# Patient Record
Sex: Male | Born: 1980 | Race: Black or African American | Hispanic: No | Marital: Single | Smoking: Never smoker
Health system: Southern US, Community
[De-identification: ages and names within clinical notes are randomized; demographics above are authoritative.]

## PROBLEM LIST (undated history)

## (undated) DIAGNOSIS — J309 Allergic rhinitis, unspecified: Secondary | ICD-10-CM

## (undated) HISTORY — PX: RECONSTRUCTION OF EYELID: SHX6576

## (undated) HISTORY — DX: Allergic rhinitis, unspecified: J30.9

---

## 2007-08-25 ENCOUNTER — Emergency Department (HOSPITAL_COMMUNITY): Admission: EM | Admit: 2007-08-25 | Discharge: 2007-08-25 | Payer: Self-pay | Admitting: Emergency Medicine

## 2008-03-22 ENCOUNTER — Emergency Department (HOSPITAL_COMMUNITY): Admission: EM | Admit: 2008-03-22 | Discharge: 2008-03-22 | Payer: Self-pay | Admitting: Emergency Medicine

## 2008-05-01 ENCOUNTER — Emergency Department (HOSPITAL_COMMUNITY): Admission: EM | Admit: 2008-05-01 | Discharge: 2008-05-01 | Payer: Self-pay | Admitting: Family Medicine

## 2008-11-13 ENCOUNTER — Emergency Department (HOSPITAL_COMMUNITY): Admission: EM | Admit: 2008-11-13 | Discharge: 2008-11-13 | Payer: Self-pay | Admitting: Emergency Medicine

## 2009-09-10 ENCOUNTER — Emergency Department (HOSPITAL_COMMUNITY): Admission: EM | Admit: 2009-09-10 | Discharge: 2009-09-10 | Payer: Self-pay | Admitting: Family Medicine

## 2010-03-13 ENCOUNTER — Emergency Department (HOSPITAL_COMMUNITY)
Admission: EM | Admit: 2010-03-13 | Discharge: 2010-03-13 | Payer: Self-pay | Source: Home / Self Care | Admitting: Family Medicine

## 2010-06-29 ENCOUNTER — Emergency Department (HOSPITAL_COMMUNITY): Admission: EM | Admit: 2010-06-29 | Discharge: 2010-06-29 | Payer: Self-pay | Admitting: Family Medicine

## 2010-10-25 LAB — POCT I-STAT, CHEM 8
Chloride: 102 mEq/L (ref 96–112)
HCT: 55 % — ABNORMAL HIGH (ref 39.0–52.0)
Hemoglobin: 18.7 g/dL — ABNORMAL HIGH (ref 13.0–17.0)
Sodium: 140 mEq/L (ref 135–145)
TCO2: 31 mmol/L (ref 0–100)

## 2010-10-27 LAB — CULTURE, ROUTINE-ABSCESS: Gram Stain: NONE SEEN

## 2011-05-09 LAB — POCT RAPID STREP A: Streptococcus, Group A Screen (Direct): NEGATIVE

## 2011-05-12 LAB — POCT URINALYSIS DIP (DEVICE)
Nitrite: NEGATIVE
Operator id: 30745
Specific Gravity, Urine: >=1.03

## 2011-10-02 IMAGING — CR DG CERVICAL SPINE COMPLETE 4+V
6 series · 6 of 6 positions shown · non-contrast
Comparison: None.

CLINICAL DATA: Right-sided neck pain, no acute injury

CERVICAL SPINE - COMPLETE 4+ VIEW

[view not recorded (1 of 6)]
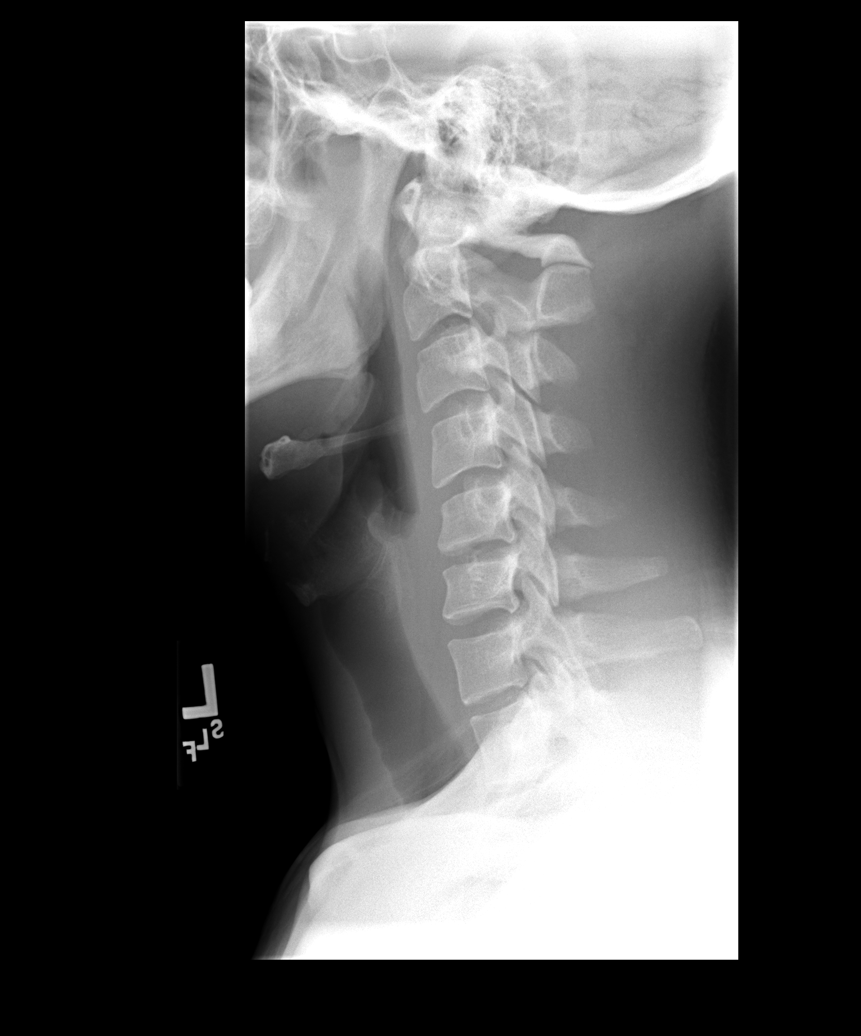

[view not recorded (2 of 6)]
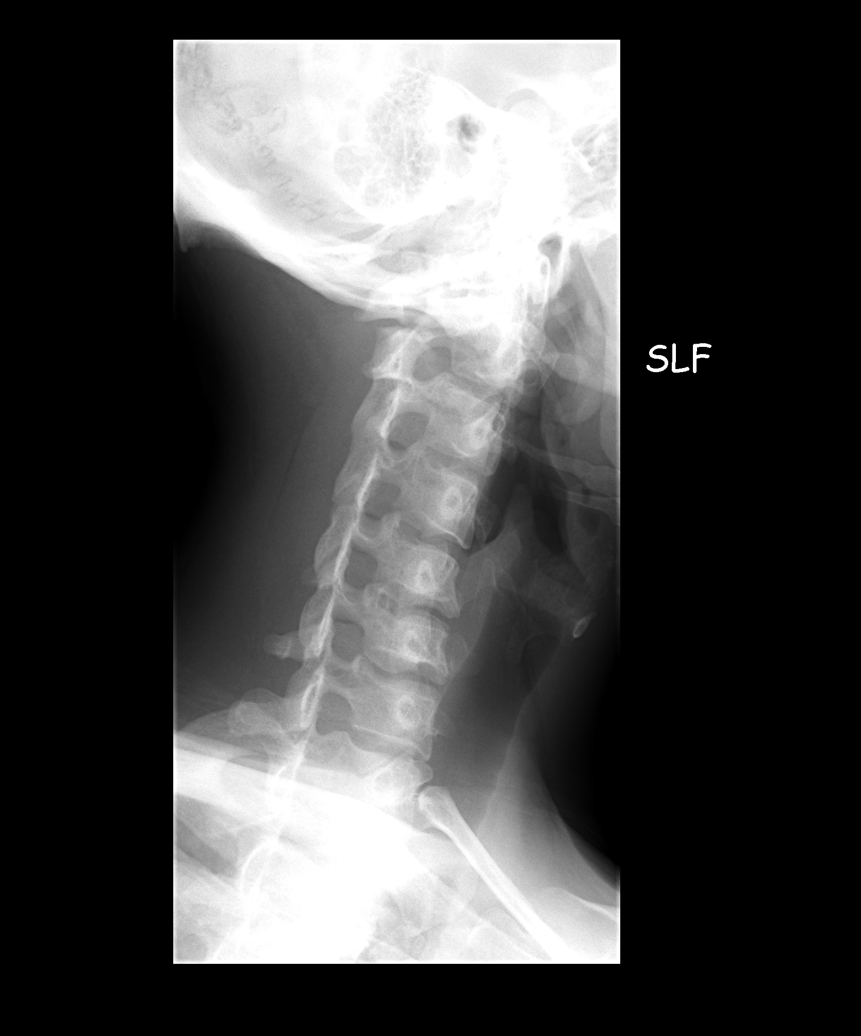

[view not recorded (3 of 6)]
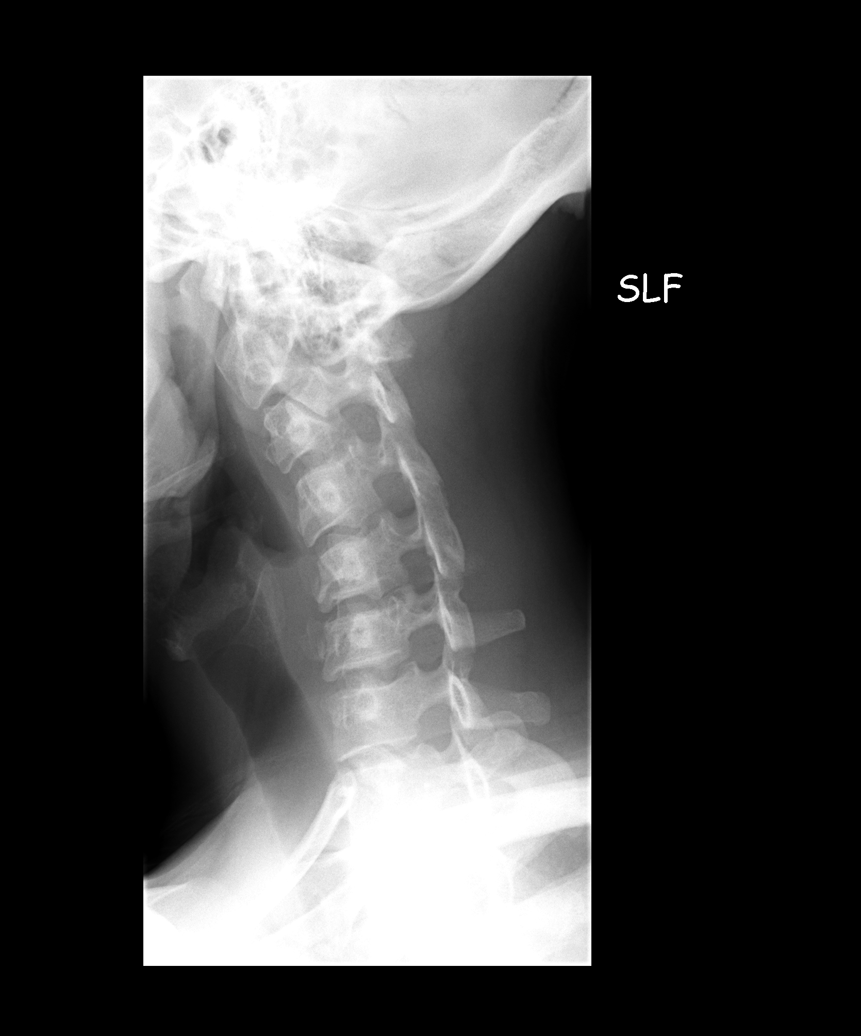

[view not recorded (4 of 6)]
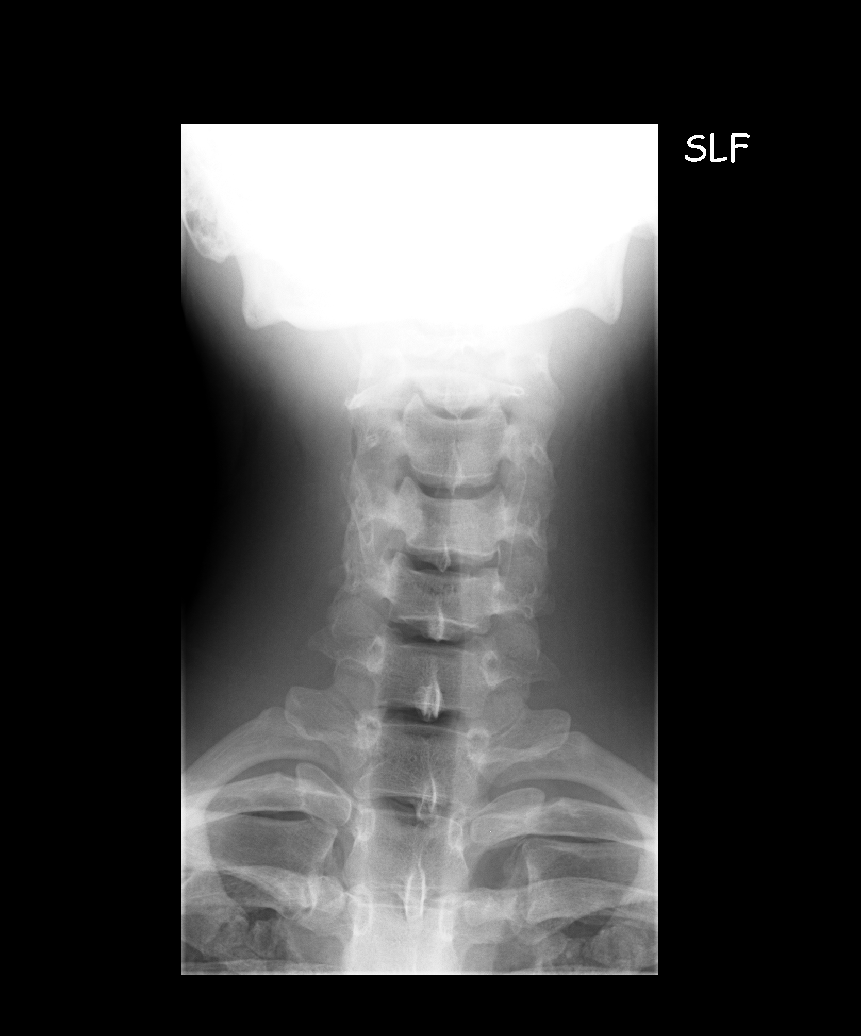

[view not recorded (5 of 6)]
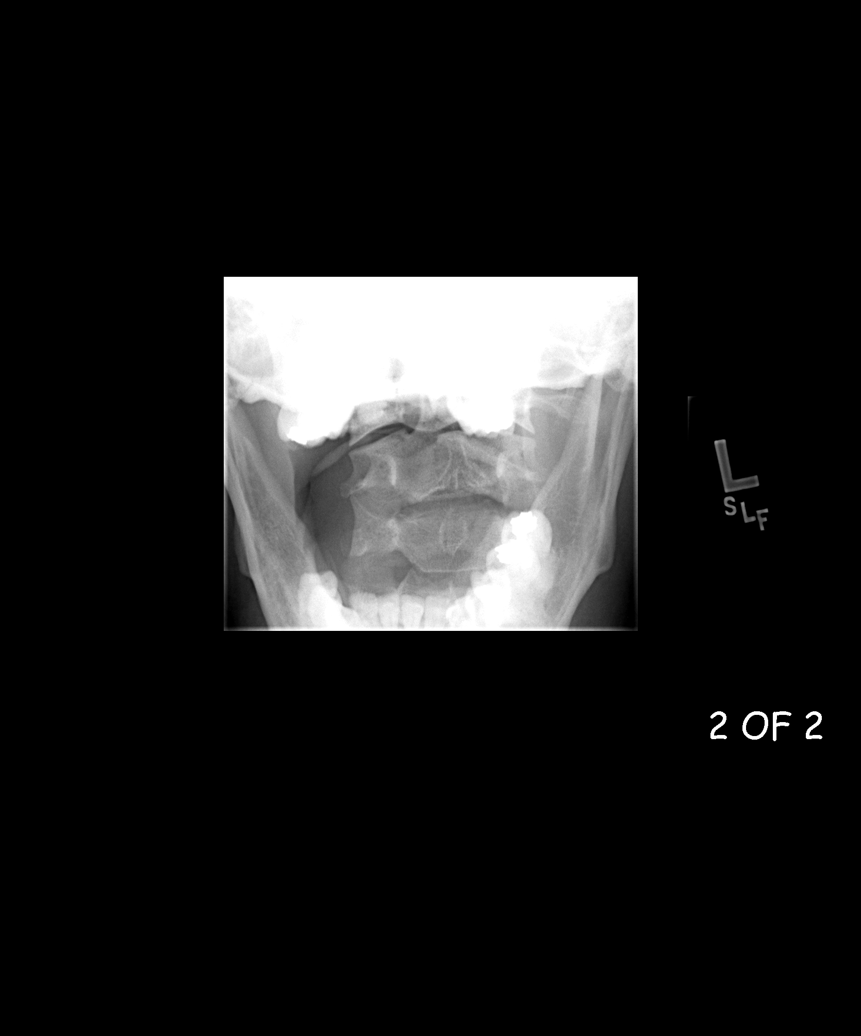

[view not recorded (6 of 6)]
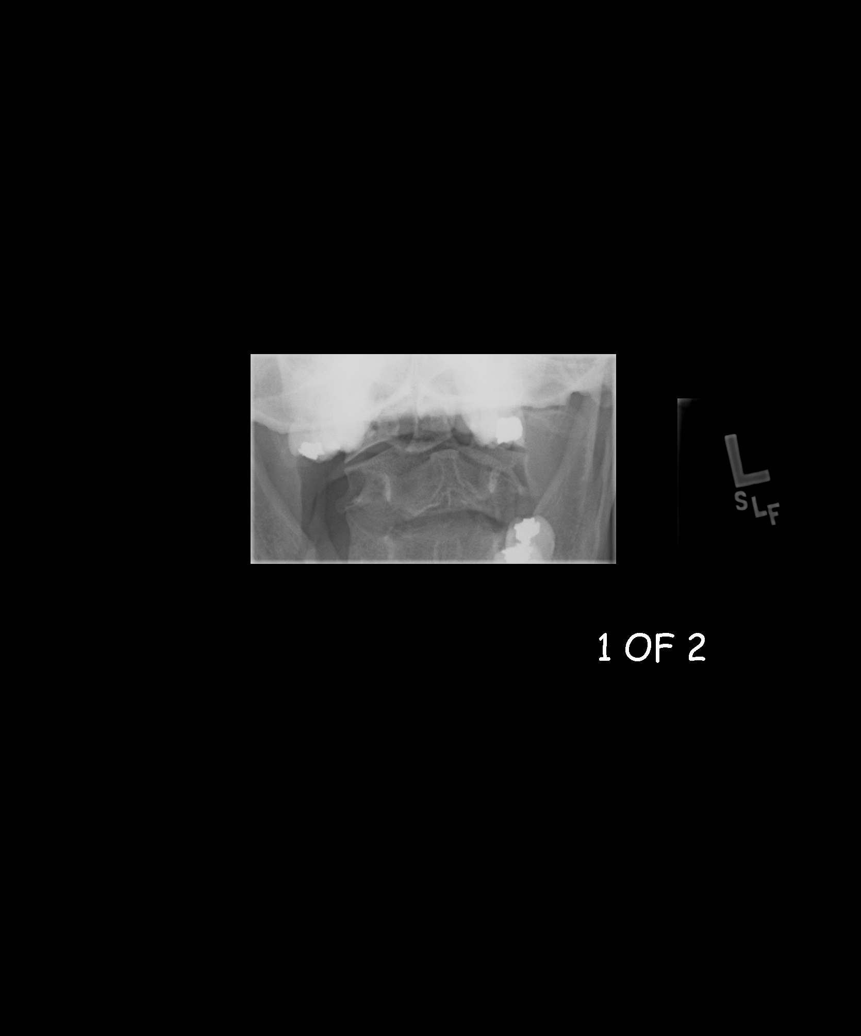

[6 of 6 positions shown; findings below may reference images not displayed]

FINDINGS: The cervical vertebrae are straightened in alignment.
Intervertebral disc spaces are normal.  No prevertebral soft tissue
swelling is seen.  On oblique views the foramina are widely patent.
The odontoid process is not optimally seen but appears intact.
IMPRESSION: Straightened alignment with normal disc spaces.  No foraminal
narrowing.

## 2012-11-04 ENCOUNTER — Emergency Department (HOSPITAL_COMMUNITY)
Admission: EM | Admit: 2012-11-04 | Discharge: 2012-11-04 | Disposition: A | Discharge status: Home / Self Care | Payer: Medicaid Other | Attending: Emergency Medicine | Admitting: Emergency Medicine

## 2012-11-04 ENCOUNTER — Encounter (HOSPITAL_COMMUNITY): Payer: Self-pay | Admitting: Emergency Medicine

## 2012-11-04 DIAGNOSIS — J302 Other seasonal allergic rhinitis: Secondary | ICD-10-CM

## 2012-11-04 DIAGNOSIS — H5789 Other specified disorders of eye and adnexa: Secondary | ICD-10-CM | POA: Insufficient documentation

## 2012-11-04 DIAGNOSIS — R220 Localized swelling, mass and lump, head: Secondary | ICD-10-CM | POA: Insufficient documentation

## 2012-11-04 DIAGNOSIS — J069 Acute upper respiratory infection, unspecified: Secondary | ICD-10-CM | POA: Insufficient documentation

## 2012-11-04 DIAGNOSIS — J309 Allergic rhinitis, unspecified: Secondary | ICD-10-CM | POA: Insufficient documentation

## 2012-11-04 DIAGNOSIS — J3489 Other specified disorders of nose and nasal sinuses: Secondary | ICD-10-CM | POA: Insufficient documentation

## 2012-11-04 MED ORDER — SODIUM CHLORIDE 0.65 % NA SOLN: 1.0000 | NASAL | Status: DC | PRN

## 2012-11-04 NOTE — ED Notes (Signed)
Pt complains of "swollen area to my neck for a week"

## 2012-11-04 NOTE — ED Provider Notes (Signed)
History     CSN: [id number]  Arrival date & time 11/04/12  1021   First MD Initiated Contact with Patient 11/04/12 1023      No chief complaint on file.   (Consider location/radiation/quality/duration/timing/severity/associated sxs/prior treatment) HPI   Dean Edwards is a 32 y.o. male who complains of congestion, swollen glands and itchy throat for 7 days. He denies a history of anorexia, chest pain, chills, dizziness, fatigue, fevers, myalgias, nausea, rash, shortness of breath, sweats, vomiting, weakness, wheezing, cough and sputum production and does not a history of asthma. Patient does not smoke cigarettes. Has seasonal allergies. No sick contacts. Patient denies fevers, chills, nausea, vomiting, or diarrhea.        History reviewed. No pertinent past medical history.  History reviewed. No pertinent past surgical history.  No family history on file.  History  Substance Use Topics  . Smoking status: Never Smoker   . Smokeless tobacco: Not on file  . Alcohol Use: No      Review of Systems  Constitutional: Negative for fever and chills.  HENT: Positive for congestion, rhinorrhea and sinus pressure. Negative for hearing loss, ear pain, sore throat, trouble swallowing, neck pain, neck stiffness and ear discharge.   Eyes: Positive for discharge and itching. Negative for pain and visual disturbance.       D/t tearing  Respiratory: Negative for cough, shortness of breath and wheezing.   Cardiovascular: Negative for chest pain.  Gastrointestinal: Negative for nausea, vomiting and abdominal pain.  Genitourinary: Negative for dysuria.  Skin: Negative for rash.  Neurological: Negative for dizziness, light-headedness and headaches.    Allergies  Review of patient's allergies indicates not on file.  Home Medications  No current outpatient prescriptions on file.  BP 134/73  Pulse 80  Temp(Src) 98.1 F (36.7 C) (Oral)  Resp 15  SpO2 100%  Physical Exam   Constitutional: He is oriented to person, place, and time. He appears well-developed and well-nourished.  HENT:  Head: Normocephalic and atraumatic. No trismus in the jaw.  Right Ear: Tympanic membrane, external ear and ear canal normal. No drainage, swelling or tenderness.  Left Ear: Tympanic membrane, external ear and ear canal normal. No drainage, swelling or tenderness.  Nose: No rhinorrhea. Right sinus exhibits no maxillary sinus tenderness and no frontal sinus tenderness. Left sinus exhibits no maxillary sinus tenderness and no frontal sinus tenderness.  Mouth/Throat: Uvula is midline, oropharynx is clear and moist and mucous membranes are normal. No oral lesions. No dental abscesses or edematous.  Neck: Trachea normal and full passive range of motion without pain. Neck supple. No muscular tenderness present. No tracheal deviation present.  Cardiovascular: Normal rate, regular rhythm and normal heart sounds.   Pulmonary/Chest: Effort normal and breath sounds normal. No respiratory distress. He has no wheezes.  Abdominal: Soft.  Lymphadenopathy:    He has no cervical adenopathy.  Neurological: He is alert and oriented to person, place, and time.  Skin: Skin is warm and dry.    ED Course  Procedures (including critical care time)  Labs Reviewed - No data to display No results found.   1. Viral upper respiratory illness   2. Seasonal allergies       MDM  Patient is a 32 yo M presenting w/ 7 days of URI symptoms. He appears well, vital signs are as noted. Ears normal.  Throat and pharynx normal.  Neck supple. No adenopathy in the neck. Nose is congested. Sinuses non tender. The chest is clear,  without wheezes or rales. Patient most likely has viral upper respiratory illness exacerbated by seasonal allergies. Symptomatic therapy suggested: push fluids, rest, return office visit prn if symptoms persist or worsen and saline nasal spray for congestion. Lack of antibiotic effectiveness  discussed with him. Patient given resource guide to find PCP in the area. Return precautions given. Call or return to clinic prn if these symptoms worsen or fail to improve as anticipated. Patient agreeable to plan. Patient is stable at time of discharge.       Jeannetta Ellis, PA-C 11/04/12 1103

## 2012-11-04 NOTE — ED Provider Notes (Signed)
Medical screening examination/treatment/procedure(s) were performed by non-physician practitioner and as supervising physician I was immediately available for consultation/collaboration.  Gilda Crease, MD 11/04/12 [phone]

## 2012-11-04 NOTE — ED Notes (Signed)
MD at bedside. 

## 2012-12-08 ENCOUNTER — Emergency Department (HOSPITAL_COMMUNITY)
Admission: EM | Admit: 2012-12-08 | Discharge: 2012-12-08 | Discharge status: Absent without leave | Payer: Self-pay | Source: Home / Self Care

## 2012-12-08 NOTE — ED Notes (Signed)
Pt called x 3 no answer 

## 2012-12-08 NOTE — ED Notes (Signed)
Pt called x 1 no answer

## 2012-12-08 NOTE — ED Notes (Signed)
Pt called 2x no answer 

## 2013-01-11 ENCOUNTER — Encounter (HOSPITAL_COMMUNITY): Payer: Self-pay | Admitting: Emergency Medicine

## 2013-01-11 ENCOUNTER — Emergency Department (HOSPITAL_COMMUNITY)
Admission: EM | Admit: 2013-01-11 | Discharge: 2013-01-11 | Disposition: A | Discharge status: Home / Self Care | Payer: Medicaid Other | Source: Home / Self Care

## 2013-01-11 DIAGNOSIS — J040 Acute laryngitis: Secondary | ICD-10-CM

## 2013-01-11 DIAGNOSIS — R109 Unspecified abdominal pain: Secondary | ICD-10-CM

## 2013-01-11 DIAGNOSIS — J309 Allergic rhinitis, unspecified: Secondary | ICD-10-CM

## 2013-01-11 LAB — POCT URINALYSIS DIP (DEVICE)
Hgb urine dipstick: NEGATIVE
Ketones, ur: NEGATIVE mg/dL
Protein, ur: 30 mg/dL — AB
Urobilinogen, UA: 1 mg/dL (ref 0.0–1.0)

## 2013-01-11 MED ORDER — METHYLPREDNISOLONE 4 MG PO KIT: PACK | ORAL | Status: DC

## 2013-01-11 MED ORDER — AZELASTINE HCL 0.1 % NA SOLN: 1.0000 | Freq: Two times a day (BID) | NASAL | Status: DC

## 2013-01-11 MED ORDER — FEXOFENADINE HCL 180 MG PO TABS: 180.0000 mg | ORAL_TABLET | Freq: Every day | ORAL | Status: DC

## 2013-01-11 NOTE — ED Provider Notes (Signed)
Medical screening examination/treatment/procedure(s) were performed by non-physician practitioner and as supervising physician I was immediately available for consultation/collaboration.  Leslee Home, M.D.   Reuben Likes, MD 01/11/13 1102

## 2013-01-11 NOTE — ED Notes (Signed)
Pt c/o swollen lymph nodes x 1 months on both sides of his neck and under her chin. Denies fever. But has a sore throat and pain. Mostly painful when he sneezes. Has tried taking Zyrtec and Flonase with no relief. Also pt c/o tightness after he eats in his abdomen. No other problems urinating or with bowel movements. Patient is alert and oriented.

## 2013-01-11 NOTE — ED Provider Notes (Signed)
History     CSN: [id number]  Arrival date & time 01/11/13  1001   None     Chief Complaint  Patient presents with  . Allergic Rhinitis   . GI Problem    (Consider location/radiation/quality/duration/timing/severity/associated sxs/prior treatment) HPI Comments: 32 year old male presents with a complaint of an enlarged lymph node in his anterior neck for a month. Is also complaining of PND, occasional choking sensation associated with PND especially while supine. States he seems a lot and he is course after sitting. The symptoms have been occurring for at least a month. He was seen in emergency department the same thing and diagnosed with possible URI associated with allergic rhinitis. He is taking Zyrtec with modest relief but it makes him feel too drowsy and foggy. Second complaint is that of lower midline intestinal cramping that occurs shortly after eating a meal. Occasionally, following a crampy you have a loose bowel movement. Denies other abdominal pain. This has been occurring since the first week of this year.   History reviewed. No pertinent past medical history.  History reviewed. No pertinent past surgical history.  No family history on file.  History  Substance Use Topics  . Smoking status: Never Smoker   . Smokeless tobacco: Not on file  . Alcohol Use: No      Review of Systems  Constitutional: Negative for fever, diaphoresis, activity change and fatigue.  HENT: Positive for congestion, sore throat, rhinorrhea and postnasal drip. Negative for ear pain, nosebleeds, facial swelling, trouble swallowing, neck pain and neck stiffness.   Eyes: Negative for pain, discharge and redness.  Respiratory: Positive for cough. Negative for chest tightness, shortness of breath and wheezing.   Cardiovascular: Negative.   Gastrointestinal: Positive for abdominal pain. Negative for nausea, vomiting, diarrhea and blood in stool.       Occasional cramping postprandial for 6 months.   Genitourinary: Negative.   Musculoskeletal: Negative.   Neurological: Negative.     Allergies  Onion  Home Medications   Current Outpatient Rx  Name  Route  Sig  Dispense  Refill  . azelastine (ASTELIN) 137 MCG/SPRAY nasal spray   Nasal   Place 1 spray into the nose 2 (two) times daily. Use in each nostril as directed   30 mL   0   . cetirizine (ZYRTEC) 10 MG tablet   Oral   Take 10 mg by mouth daily.         . fexofenadine (ALLEGRA) 180 MG tablet   Oral   Take 1 tablet (180 mg total) by mouth daily.   20 tablet   0   . ibuprofen (ADVIL,MOTRIN) 200 MG tablet   Oral   Take 200 mg by mouth every 6 (six) hours as needed for pain or headache.         . methylPREDNISolone (MEDROL DOSEPAK) 4 MG tablet      follow package directions   21 tablet   0   . sodium chloride (OCEAN) 0.65 % nasal spray   Nasal   Place 1 spray into the nose as needed for congestion.   15 mL   0     BP 132/75  Pulse 89  Temp(Src) 97.4 F (36.3 C) (Oral)  Resp 16  SpO2 99%  Physical Exam  Nursing note and vitals reviewed. Constitutional: He is oriented to person, place, and time. He appears well-developed and well-nourished. No distress.  HENT:  Bilateral TMs are retracted oropharynx is mildly erythematous with a copious amount of  posterior pharyngeal clear PND. No exudates.   Eyes: Conjunctivae and EOM are normal.  Neck: Normal range of motion. Neck supple.  Cardiovascular: Normal rate, regular rhythm and normal heart sounds.   Pulmonary/Chest: Effort normal and breath sounds normal. No respiratory distress.  Abdominal: Soft. Bowel sounds are normal. He exhibits no distension and no mass. There is no tenderness. There is no rebound and no guarding.  Musculoskeletal: Normal range of motion. He exhibits no edema.  Lymphadenopathy:       Head (right side): No submental, no preauricular and no posterior auricular adenopathy present.       Head (left side): No submental, no  submandibular, no preauricular and no posterior auricular adenopathy present.    He has cervical adenopathy.       Right cervical: Superficial cervical adenopathy present.       Left cervical: Superficial cervical adenopathy present.  There are 2 small nonpathologic, nontender anterior cervical lymph nodes. The patient points to the midline in the upper portion of the neck as the area of left eye. Palpation reveals that he is pointing to the superior edge of the cricoid cartilage. There are no midline cervical nodes. No enlarged nodes. No other nodules or lesions are observed or palpated.  Neurological: He is alert and oriented to person, place, and time.  Skin: Skin is warm and dry. No rash noted.  Psychiatric: He has a normal mood and affect.    ED Course  Procedures (including critical care time)  Labs Reviewed  POCT URINALYSIS DIP (DEVICE) - Abnormal; Notable for the following:    Protein, ur 30 (*)    All other components within normal limits   No results found.   1. Allergic rhinitis due to allergen   2. Laryngitis without obstruction, acute   3. Abdominal cramping       MDM  Stop Zyrtec and start Allegra 180 mg daily Continuing fluticasone nasal spray Aad Astelin nasal spray as directed Medrol Dosepak as directed Obtain a PCP as above. Call these numbers. He may need to followup with gastroenterology for additional violation of GI symptoms.       Hayden Rasmussen, NP 01/11/13 1054

## 2013-01-25 ENCOUNTER — Emergency Department (HOSPITAL_COMMUNITY)
Admission: EM | Admit: 2013-01-25 | Discharge: 2013-01-26 | Disposition: A | Discharge status: Home / Self Care | Payer: Medicaid Other | Attending: Emergency Medicine | Admitting: Emergency Medicine

## 2013-01-25 DIAGNOSIS — K645 Perianal venous thrombosis: Secondary | ICD-10-CM | POA: Insufficient documentation

## 2013-01-25 DIAGNOSIS — K6289 Other specified diseases of anus and rectum: Secondary | ICD-10-CM | POA: Insufficient documentation

## 2013-01-26 MED ORDER — LIDOCAINE-EPINEPHRINE (PF) 2 %-1:200000 IJ SOLN: 10.0000 mL | Freq: Once | INTRAMUSCULAR | Status: DC

## 2013-01-26 MED ORDER — HYDROCORTISONE 2.5 % RE CREA: TOPICAL_CREAM | RECTAL | Status: DC

## 2013-01-26 MED ORDER — IBUPROFEN 800 MG PO TABS: 800.0000 mg | ORAL_TABLET | Freq: Three times a day (TID) | ORAL | Status: DC

## 2013-01-26 MED ORDER — LIDOCAINE HCL 2 % EX GEL
Freq: Once | CUTANEOUS | Status: DC
  Filled 2013-01-26: qty 10

## 2013-01-26 NOTE — ED Provider Notes (Signed)
History     CSN: [id number]  Arrival date & time 01/25/13  2356   First MD Initiated Contact with Patient 01/26/13 0008      No chief complaint on file.   (Consider location/radiation/quality/duration/timing/severity/associated sxs/prior treatment) HPI  32 year old male presents c/o pain and swelling to rectum.  sxs started since yesterday.  Pt notice swelling and pain to rectum, worsening with palpation or with having bowel movement.  Denies blood per rectum, denies recent trauma, denies fever, chills or rash.  Has tried soaking butt in warm water with some relief.  No other complaint.    No past medical history on file.  No past surgical history on file.  No family history on file.  History  Substance Use Topics  . Smoking status: Never Smoker   . Smokeless tobacco: Not on file  . Alcohol Use: No      Review of Systems  Constitutional: Negative for fever.  Gastrointestinal: Positive for rectal pain. Negative for anal bleeding.  Neurological: Negative for numbness.    Allergies  Onion  Home Medications   Current Outpatient Rx  Name  Route  Sig  Dispense  Refill  . azelastine (ASTELIN) 137 MCG/SPRAY nasal spray   Nasal   Place 1 spray into the nose 2 (two) times daily. Use in each nostril as directed   30 mL   0   . fexofenadine (ALLEGRA) 180 MG tablet   Oral   Take 1 tablet (180 mg total) by mouth daily.   20 tablet   0   . ibuprofen (ADVIL,MOTRIN) 200 MG tablet   Oral   Take 200 mg by mouth every 6 (six) hours as needed for pain or headache.         . methylPREDNISolone (MEDROL DOSEPAK) 4 MG tablet      follow package directions   21 tablet   0   . sodium chloride (OCEAN) 0.65 % nasal spray   Nasal   Place 1 spray into the nose as needed for congestion.   15 mL   0     BP 130/76  Pulse 78  Temp(Src) 98.1 F (36.7 C) (Oral)  Resp 18  Wt 170 lb (77.111 kg)  SpO2 97%  Physical Exam  Nursing note and vitals reviewed. Constitutional:  He appears well-developed and well-nourished. No distress.  HENT:  Head: Atraumatic.  Eyes: Conjunctivae are normal.  Neck: Neck supple.  Abdominal: There is no tenderness.  Genitourinary:  Pt with external thrombosed hemorrhoids, ttp, normal rectal tone, no blood per rectum.    Neurological: He is alert.  Skin: No rash noted.    ED Course  Procedures (including critical care time)  Pt with thrombosed hemorrhoid.  Option as treatment include excision.  However, pt prefers non invasive approach.  Will apply Urojet numbing agent for relief.  I recommend sitz bath, food high in fiber, anusol and non-narcotic pain meds.  Pt to return in 3-4 days if worsen.  Pt agrees with plan.  Doubt peri-rectal abscess at this time.    Labs Reviewed - No data to display No results found.   1. Thrombosed external hemorrhoid       MDM  BP 130/76  Pulse 78  Temp(Src) 98.1 F (36.7 C) (Oral)  Resp 18  Wt 170 lb (77.111 kg)  SpO2 97%         Fayrene Helper, PA-C 01/26/13 0030

## 2013-01-26 NOTE — ED Notes (Signed)
Pt c/o boil to R side of buttocks to upper buttocks. Pt states he noticed this about 2 days ago and it is much worse now. Pt arrives with friend. Pt ambulatory to exam room with steady gait.

## 2013-01-27 NOTE — ED Provider Notes (Signed)
  Medical screening examination/treatment/procedure(s) were performed by non-physician practitioner and as supervising physician I was immediately available for consultation/collaboration.    Gerhard Munch, MD 01/27/13 [phone]

## 2013-04-13 ENCOUNTER — Encounter: Payer: Self-pay | Admitting: Internal Medicine

## 2013-04-13 ENCOUNTER — Ambulatory Visit (INDEPENDENT_AMBULATORY_CARE_PROVIDER_SITE_OTHER): Payer: Medicaid Other | Admitting: Internal Medicine

## 2013-04-13 VITALS — BP 127/81 | HR 71 | Temp 98.1°F | Ht 69.5 in | Wt 187.6 lb

## 2013-04-13 DIAGNOSIS — Z23 Encounter for immunization: Secondary | ICD-10-CM

## 2013-04-13 DIAGNOSIS — J309 Allergic rhinitis, unspecified: Secondary | ICD-10-CM

## 2013-04-13 DIAGNOSIS — Z0000 Encounter for general adult medical examination without abnormal findings: Secondary | ICD-10-CM

## 2013-04-13 DIAGNOSIS — K644 Residual hemorrhoidal skin tags: Secondary | ICD-10-CM | POA: Insufficient documentation

## 2013-04-13 MED ORDER — FEXOFENADINE HCL 180 MG PO TABS: 180.0000 mg | ORAL_TABLET | Freq: Every day | ORAL | Status: DC

## 2013-04-13 NOTE — Assessment & Plan Note (Signed)
Gave flu shot

## 2013-04-13 NOTE — Assessment & Plan Note (Signed)
Refilled allegra

## 2013-04-13 NOTE — Patient Instructions (Addendum)
I have sent in a prescription of Allegra for you with 6 refills  Please follow up in 6 months

## 2013-04-13 NOTE — Progress Notes (Signed)
Patient ID: Dean Edwards, male   DOB: 1981/02/09, 32 y.o.   MRN: [id number]    Patient: Dean Edwards   MRN: [id number]  DOB: 1981-03-04  PCP: Dow Adolph, MD   Subjective:    CC: Annual Exam   HPI: Dean Edwards is a 32 y.o. male with a PMHx of as outlined below, who presented to clinic today to establish care with a new PCP. He recently moved from [place] to attend college in [place]. Parents in [place]. He, therefore, wishes to establish care at our clinic. The last time he saw a Dr. was 7 yrs ago. He doesn't have any active medical problems except his chronic allergic rhinitis, which she describes as well controlled on Allegra.  I reviewed the patient's past medical, past surgical, family and social history. Updated his chart.   Review of Systems: Constitutional:  denies fever, chills, diaphoresis, appetite change and fatigue.  HEENT: denies photophobia, eye pain, redness, hearing loss, ear pain, congestion, sore throat, rhinorrhea, sneezing, neck pain, neck stiffness and tinnitus.  Respiratory: denies SOB, DOE, cough, chest tightness, and wheezing.  Cardiovascular: denies chest pain, palpitations and leg swelling.  Gastrointestinal: denies nausea, vomiting, abdominal pain, diarrhea, constipation, blood in stool.  Genitourinary: denies dysuria, urgency, frequency, hematuria, flank pain and difficulty urinating.  Musculoskeletal: denies  myalgias, back pain, joint swelling, arthralgias and gait problem.   Skin: denies pallor, rash and wound.  Neurological: denies dizziness, seizures, syncope, weakness, light-headedness, numbness and headaches.   Hematological: denies adenopathy, easy bruising, personal or family bleeding history.  Psychiatric/ Behavioral: denies suicidal ideation, mood changes, confusion, nervousness, sleep disturbance and agitation.      Current Outpatient Medications: Current Outpatient Prescriptions  Medication Sig Dispense Refill  . fexofenadine  (ALLEGRA) 180 MG tablet Take 1 tablet (180 mg total) by mouth daily.  20 tablet  6  . ibuprofen (ADVIL,MOTRIN) 800 MG tablet Take 1 tablet (800 mg total) by mouth 3 (three) times daily.  21 tablet  0  . sodium chloride (OCEAN) 0.65 % nasal spray Place 1 spray into the nose as needed for congestion.  15 mL  0   No current facility-administered medications for this visit.     Allergies  Allergen Reactions  . Onion Anaphylaxis    Past Medical History  Diagnosis Date  . Allergic rhinitis chronic    History reviewed. No pertinent past surgical history.  Family History  Problem Relation Age of Onset  . Heart disease Mother   . Heart disease Father   . Heart disease Maternal Grandmother      Social History History   Social History  . Marital Status: Single    Spouse Name: N/A    Number of Children: N/A  . Years of Education: N/A   Social History Main Topics  . Smoking status: Never Smoker   . Smokeless tobacco: None  . Alcohol Use: No  . Drug Use: No  . Sexual Activity: None   Other Topics Concern  . None   Social History Narrative  . None. 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     Objective:    Physical Exam: Filed Vitals:   04/13/13 1450  BP: 127/81  Pulse: 71  Temp: 98.1 F (36.7 C)      General: Vital signs reviewed and noted. Well-developed, well-nourished, in no acute distress; alert, appropriate and cooperative throughout examination.  Head: Normocephalic, atraumatic.  Eyes: conjunctivae/corneas clear. PERRL, EOM's  intact. Fundi benign.  Ears: TM nonerythematous, not bulging, good light reflex bilaterally.  Nose: Mucous membranes moist, not inflammed, nonerythematous.  Throat: Oropharynx nonerythematous, no exudate appreciated.   Neck: No deformities, masses, or tenderness noted.  Lungs:  Normal respiratory effort. Clear to auscultation BL without crackles or wheezes.  Heart: RRR. S1 and  S2 normal without gallop, rubs. No  murmur.  Abdomen:  BS normoactive. Soft, Nondistended, non-tender.  No masses or organomegaly.  Extremities: No pretibial edema.  Neurologic: A&O X3, CN II - XII are grossly intact. Motor strength is 5/5 in the all 4 extremities, Sensations intact to light touch, Cerebellar signs negative.     Assessment/ Plan:   I discussed my assessment, and plan for the care of this patient with Dr. Meredith Pel. Patient in good health. He request refills of his Allegra. Counseled the patient on health maintenance and gave flu shot today. I recommended that he follows up with me in 6 months.

## 2013-04-22 NOTE — Progress Notes (Signed)
Case discussed with Dr. Kazibwe soon after the resident saw the patient.  We reviewed the resident's history and exam and pertinent patient test results.  I agree with the assessment, diagnosis, and plan of care documented in the resident's note. 

## 2013-10-12 ENCOUNTER — Encounter: Payer: Medicaid Other | Admitting: Internal Medicine

## 2013-10-19 ENCOUNTER — Encounter: Payer: Medicaid Other | Admitting: Internal Medicine

## 2013-11-02 ENCOUNTER — Encounter: Payer: Self-pay | Admitting: Internal Medicine

## 2013-11-02 ENCOUNTER — Ambulatory Visit (INDEPENDENT_AMBULATORY_CARE_PROVIDER_SITE_OTHER): Payer: Medicaid Other | Admitting: Internal Medicine

## 2013-11-02 VITALS — BP 122/75 | HR 74 | Temp 97.4°F | Ht 69.5 in | Wt 191.8 lb

## 2013-11-02 DIAGNOSIS — Z0000 Encounter for general adult medical examination without abnormal findings: Secondary | ICD-10-CM

## 2013-11-02 DIAGNOSIS — J309 Allergic rhinitis, unspecified: Secondary | ICD-10-CM

## 2013-11-02 DIAGNOSIS — Z23 Encounter for immunization: Secondary | ICD-10-CM

## 2013-11-02 DIAGNOSIS — K644 Residual hemorrhoidal skin tags: Secondary | ICD-10-CM

## 2013-11-02 MED ORDER — FLUTICASONE PROPIONATE 50 MCG/ACT NA SUSP: 1.0000 | Freq: Every day | NASAL | Status: DC

## 2013-11-02 NOTE — Assessment & Plan Note (Signed)
Give tdap today.

## 2013-11-02 NOTE — Patient Instructions (Signed)
Please start using Flonase spray daily  Please take Allegra for your allergies.  Please follow up in 1 year.

## 2013-11-02 NOTE — Progress Notes (Signed)
   Subjective:   HPI: [name] is a 33 y.o. African American gentleman, without significant past medical history presents for a routine followup visit.  Patient reports that his symptoms of chronic allergic rhinitis have increased since the beginning of spring. He also tells me that his been off his Allegra for several months since he was asymptomatic. He reports that sometimes he feels his nose is stuffy and it is difficult for him to breathe. He denies other ENT symptoms. He relates his recent worsening of symptoms to change of weather. He requested to refill his Allegra. He has never been evaluated by an allergist or an ENT specialist.    Past Medical History  Diagnosis Date  . Allergic rhinitis chronic   Current Outpatient Prescriptions  Medication Sig Dispense Refill  . fexofenadine (ALLEGRA) 180 MG tablet Take 1 tablet (180 mg total) by mouth daily.  20 tablet  6  . sodium chloride (OCEAN) 0.65 % nasal spray Place 1 spray into the nose as needed for congestion.  15 mL  0  . fluticasone (FLONASE) 50 MCG/ACT nasal spray Place 1 spray into both nostrils daily.  16 g  2   No current facility-administered medications for this visit.   Family History  Problem Relation Age of Onset  . Heart disease Mother   . Heart disease Father   . Heart disease Maternal Grandmother    History   Social History  . Marital Status: Single    Spouse Name: N/A    Number of Children: N/A  . Years of Education: N/A   Social History Main Topics  . Smoking status: Never Smoker   . Smokeless tobacco: None  . Alcohol Use: No  . Drug Use: No  . Sexual Activity: None   Other Topics Concern  . None   Social History Narrative  . None   Review of Systems: Constitutional: Denies fever, chills, diaphoresis, appetite change and fatigue.  Respiratory: Denies SOB, DOE, cough, chest tightness, and wheezing. Denies chest pain. Cardiovascular: No chest pain, palpitations and leg swelling.    Gastrointestinal: No abdominal pain, nausea, vomiting, bloody stools Genitourinary: No dysuria, frequency, hematuria, or flank pain.  Musculoskeletal: No myalgias, back pain, joint swelling, arthralgias  Psych: No depression symptoms. No SI or SA.   Objective:  Physical Exam: Filed Vitals:   11/02/13 1505  BP: 122/75  Pulse: 74  Temp: 97.4 F (36.3 C)  TempSrc: Oral  Height: 5' 9.5" (1.765 m)  Weight: 191 lb 12.8 oz (87 kg)  SpO2: 100%   General: Well nourished. No acute distress.  HEENT: Normal oral mucosa. MMM. No swelling of the nasal turbinates. No rhinorrhea. Careful examination of his ears is unremarkable. Oropharynx is clear without erythema.  No tenderness over the head and sinuses. Lungs: CTA bilaterally. Heart: RRR; no extra sounds or murmurs  Abdomen: Non-distended, normal BS, soft, nontender; no hepatosplenomegaly  Extremities: No pedal edema. No joint swelling or tenderness. Neurologic: Normal EOM,  Alert and oriented x3. No obvious neurologic/cranial nerve deficits.  Assessment & Plan:  I have discussed my assessment and plan  with  my attending in the clinic, Dr. Rogelia BogaButcher  as detailed under problem based charting.

## 2013-11-02 NOTE — Assessment & Plan Note (Signed)
Most likely his symptoms have flared up with the spring.  Plan  -  I refilled his Allegra. -  I will start him on Flonase nasal spray.  - I have encouraged him to use normal saline nasal sprays if he experiences increased nasal stuffiness or drainage. - Will consider referral to an allergist or ENT  specialist if symptoms worsen or persist.

## 2013-11-07 NOTE — Progress Notes (Signed)
Case discussed with Dr. Kazibwe soon after the resident saw the patient.  We reviewed the resident's history and exam and pertinent patient test results.  I agree with the assessment, diagnosis, and plan of care documented in the resident's note. 

## 2014-12-26 ENCOUNTER — Encounter: Payer: Self-pay | Admitting: *Deleted

## 2015-05-07 ENCOUNTER — Encounter: Payer: Medicaid Other | Admitting: Internal Medicine

## 2015-05-08 ENCOUNTER — Encounter: Payer: Self-pay | Admitting: Internal Medicine

## 2015-10-29 ENCOUNTER — Encounter: Payer: Self-pay | Admitting: Internal Medicine

## 2015-10-29 ENCOUNTER — Encounter: Payer: Medicaid Other | Admitting: Internal Medicine

## 2018-01-11 ENCOUNTER — Ambulatory Visit (HOSPITAL_COMMUNITY)
Admission: EM | Admit: 2018-01-11 | Discharge: 2018-01-11 | Disposition: A | Discharge status: Home / Self Care | Payer: Medicaid Other | Attending: Internal Medicine | Admitting: Internal Medicine

## 2018-01-11 ENCOUNTER — Encounter (HOSPITAL_COMMUNITY): Payer: Self-pay | Admitting: Emergency Medicine

## 2018-01-11 ENCOUNTER — Other Ambulatory Visit: Payer: Self-pay

## 2018-01-11 DIAGNOSIS — M7582 Other shoulder lesions, left shoulder: Secondary | ICD-10-CM

## 2018-01-11 DIAGNOSIS — M62838 Other muscle spasm: Secondary | ICD-10-CM

## 2018-01-11 MED ORDER — CYCLOBENZAPRINE HCL 10 MG PO TABS: 10.0000 mg | ORAL_TABLET | Freq: Every day | ORAL | 0 refills | Status: DC

## 2018-01-11 MED ORDER — NAPROXEN 500 MG PO TABS: 500.0000 mg | ORAL_TABLET | Freq: Two times a day (BID) | ORAL | 0 refills | Status: DC | PRN

## 2018-01-11 NOTE — Discharge Instructions (Addendum)
Sling given Continue conservative management of rest, ice, heat, and gentle stretches Take naproxen as needed for pain relief (may cause abdominal discomfort, ulcers, and GI bleeds avoid taking with other NSAIDs) Take cyclobenzaprine at nighttime for symptomatic relief. Avoid driving or operating heavy machinery while using medication. Follow up with PCP if symptoms persist Return or go to the ER if you have any new or worsening symptoms (fever, chills, chest pain, abdominal pain, changes in bowel or bladder habits, pain radiating into lower legs, etc...)

## 2018-01-11 NOTE — ED Triage Notes (Signed)
Left shoulder and neck pain for 3 weeks.  No known injury.

## 2018-01-11 NOTE — ED Provider Notes (Addendum)
[facility]   [id number] 01/11/18 Arrival Time: 1546  SUBJECTIVE: History from: patient. Dean Edwards is a 37 y.o. male complains of gradual worsening neck and left shoulder pain that began 3 weeks ago.  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  Localizes the pain to the trapezius and outside of shoulder.  Describes the pain as intermittent and throbbing in character.  Has tried OTC medications such as ibuprofen, aleve, and stretches without relief.  Symptoms are made worse with lying on left side and ROM about the neck.  Reports similar symptoms in the past, but resolved with rest.  Complains of effusion, and mild numbness and tingling in left arm.    Denies fever, chills, erythema, ecchymosis, weakness, or instability.     ROS: As per HPI.  Past Medical History:  Diagnosis Date  . Allergic rhinitis chronic   Past Surgical History:  Procedure Laterality Date  . RECONSTRUCTION OF EYELID Bilateral    Allergies  Allergen Reactions  . Onion Anaphylaxis   No current facility-administered medications on file prior to encounter.    Current Outpatient Medications on File Prior to Encounter  Medication Sig Dispense Refill  . fexofenadine (ALLEGRA) 180 MG tablet Take 1 tablet (180 mg total) by mouth daily. 20 tablet 6  . fluticasone (FLONASE) 50 MCG/ACT nasal spray Place 1 spray into both nostrils daily. 16 g 2  . sodium chloride (OCEAN) 0.65 % nasal spray Place 1 spray into the nose as needed for congestion. 15 mL 0   Social History   Socioeconomic History  . Marital status: Single    Spouse name: Not on file  . Number of children: Not on file  . Years of education: Not on file  . Highest education level: Not on file  Occupational History  . Not on file  Social Needs  . Financial resource strain: Not on file  . Food insecurity:    Worry: Not on file    Inability: Not on file   . Transportation needs:    Medical: Not on file    Non-medical: Not on file  Tobacco Use  . Smoking status: Never Smoker  Substance and Sexual Activity  . Alcohol use: No  . Drug use: No  . Sexual activity: Not on file  Lifestyle  . Physical activity:    Days per week: Not on file    Minutes per session: Not on file  . Stress: Not on file  Relationships  . Social connections:    Talks on phone: Not on file    Gets together: Not on file    Attends religious service: Not on file    Active member of club or organization: Not on file    Attends meetings of clubs or organizations: Not on file    Relationship status: Not on file  . Intimate partner violence:    Fear of current or ex partner: Not on file    Emotionally abused: Not on file    Physically abused: Not on file    Forced sexual activity: Not on file  Other Topics Concern  . Not on file  Social History Narrative  . Not on file   Family History  Problem Relation Age of Onset  . Heart disease Mother   . Heart disease Father   . Heart disease Maternal Grandmother     OBJECTIVE:  Vitals:   01/11/18 1647  BP: (!) 155/77  Pulse: 97  Resp: 20  Temp: 97.6 F (36.4 C)  TempSrc: Oral  SpO2: 97%    General appearance: AOx3; in no acute distress.  Head: NCAT Lungs: CTA bilaterally Heart: RRR.  Clear S1 and S2 without murmur, gallops, or rubs.  Radial pulses 2+ bilaterally. Musculoskeletal: Left shoulder and neck  Inspection: Skin warm, dry, clear and intact without obvious erythema, effusion, or ecchymosis.  Palpation: Diffusely tender about the subacromial process, lateral deltoid and left superior edge of the trapezius ROM: FROM active Strength: 4/5 shld abduction, 5/5 shld adduction, 5/5 elbow flexion, 5/5 elbow extension, 5/5 grip strength Special tests: Negative lift-off Skin: warm and dry Neurologic: Ambulates without difficulty; Sensation intact about the upper extremities Psychological: alert and  cooperative; normal mood and affect  ASSESSMENT & PLAN:  1. Rotator cuff tendinitis, left   2. Trapezius muscle spasm     Meds ordered this encounter  Medications  . naproxen (NAPROSYN) 500 MG tablet    Sig: Take 1 tablet (500 mg total) by mouth 2 (two) times daily as needed for moderate pain.    Dispense:  30 tablet    Refill:  0    Order Specific Question:   Supervising Provider    Answer:   Isa RankinMURRAY, LAURA WILSON [id number]]  . cyclobenzaprine (FLEXERIL) 10 MG tablet    Sig: Take 1 tablet (10 mg total) by mouth at bedtime.    Dispense:  12 tablet    Refill:  0    Order Specific Question:   Supervising Provider    Answer:   Isa RankinMURRAY, LAURA WILSON [[id number]]   Sling given Continue conservative management of rest, ice, and gentle stretches Take naproxen as needed for pain relief (may cause abdominal discomfort, ulcers, and GI bleeds avoid taking with other NSAIDs) Take cyclobenzaprine at nighttime for symptomatic relief. Avoid driving or operating heavy machinery while using medication. Follow up with PCP if symptoms persist Present to ER if worsening or new symptoms (fever, chills, chest pain, abdominal pain, changes in bowel or bladder habits, pain radiating into lower legs, etc...)   Reviewed expectations re: course of current medical issues. Questions answered. Outlined signs and symptoms indicating need for more acute intervention. Patient verbalized understanding. After Visit Summary given.    Rennis HardingWurst, Adeja Sarratt, PA-C 01/11/18 1720    Alvino ChapelWurst, AustinBrittany, PA-C 01/11/18 1746

## 2018-02-16 ENCOUNTER — Ambulatory Visit: Payer: Medicaid Other | Admitting: Internal Medicine

## 2018-02-16 ENCOUNTER — Encounter: Payer: Self-pay | Admitting: Internal Medicine

## 2018-02-16 VITALS — BP 157/82 | HR 81 | Temp 98.5°F | Ht 67.0 in | Wt 214.9 lb

## 2018-02-16 DIAGNOSIS — S46812D Strain of other muscles, fascia and tendons at shoulder and upper arm level, left arm, subsequent encounter: Secondary | ICD-10-CM | POA: Diagnosis present

## 2018-02-16 DIAGNOSIS — Z8639 Personal history of other endocrine, nutritional and metabolic disease: Secondary | ICD-10-CM

## 2018-02-16 DIAGNOSIS — Z791 Long term (current) use of non-steroidal anti-inflammatories (NSAID): Secondary | ICD-10-CM

## 2018-02-16 DIAGNOSIS — Z79899 Other long term (current) drug therapy: Secondary | ICD-10-CM | POA: Diagnosis not present

## 2018-02-16 DIAGNOSIS — Z7252 High risk homosexual behavior: Secondary | ICD-10-CM | POA: Insufficient documentation

## 2018-02-16 DIAGNOSIS — I10 Essential (primary) hypertension: Secondary | ICD-10-CM | POA: Diagnosis not present

## 2018-02-16 DIAGNOSIS — S46819A Strain of other muscles, fascia and tendons at shoulder and upper arm level, unspecified arm, initial encounter: Secondary | ICD-10-CM | POA: Insufficient documentation

## 2018-02-16 DIAGNOSIS — X509XXD Other and unspecified overexertion or strenuous movements or postures, subsequent encounter: Secondary | ICD-10-CM

## 2018-02-16 MED ORDER — AMLODIPINE BESYLATE 10 MG PO TABS: 10.0000 mg | ORAL_TABLET | Freq: Every day | ORAL | 0 refills | Status: DC

## 2018-02-16 NOTE — Assessment & Plan Note (Signed)
Pt reports he is not monogamous, he is currently sexually active with men only.  Reports condom use consistently.    -will check HIV, RPR today -encouraged continued condom use -brought up idea of PrEP pt is giving it some thought and is interested, worried about side effects especially liver -will get baseline liver function and renal function test today as well.

## 2018-02-16 NOTE — Assessment & Plan Note (Signed)
His pain has has been going on for about one month now, it has been improving.  No loss of strength.  No spinal tenderness some trapezius muscle tenderness adjacent to cervical spine on left.  He was helping a lady at work get into her wheelchair, she was a rather large woman and his arm was pulled with her as he was setting her into the chair.  This is when his pain began.  Using daily exercises given at urgent care, taking flexeril which helps and naproxen.  His exam is very reassuring.  He has no joint tenderness he is neers, hawkins, empty can negative, he has full range of motion and strength and no pain elicited during exam.  He has preserved sensation down to the fingers.  No muscular tenderness on exam.    -continue supportive care as this pain is almost completely resolved -has naproxen and flexeril already

## 2018-02-16 NOTE — Progress Notes (Signed)
CC: shoulder pain,  High risk sexual behavior man that has sex with men, hx of hyperlipidemia  HPI:  [name] is a 37 y.o. male with PMH below.  He is here to address his shoulder pain,  High risk sexual behavior man that has sex with men, hx of hyperlipidemia.   Please see A&P for status of the patient's chronic medical conditions  Past Medical History:  Diagnosis Date  . Allergic rhinitis chronic   Review of Systems:  ROS: Pulmonary: pt denies increased work of breathing, shortness of breath,  Cardiac: pt denies palpitations, chest pain,  Abdominal: pt denies abdominal pain, nausea, vomiting, or diarrhea  Physical Exam:  Vitals:   02/16/18 1442  BP: (!) 157/82  Pulse: 81  Temp: 98.5 F (36.9 C)  TempSrc: Oral  SpO2: 100%  Weight: 214 lb 14.4 oz (97.5 kg)  Height: 5\' 7"  (1.702 m)   Physical Exam  Eyes: Right eye exhibits no discharge. Left eye exhibits no discharge. No scleral icterus.  Cardiovascular: Normal rate, regular rhythm and normal heart sounds. Exam reveals no gallop and no friction rub.  No murmur heard. Pulmonary/Chest: Effort normal and breath sounds normal. No respiratory distress. He has no wheezes. He has no rales.  Musculoskeletal:       Left shoulder: He exhibits normal range of motion, no tenderness, no bony tenderness, no swelling, no effusion, no crepitus, no deformity, no laceration, no pain and normal strength.  He is neer's, hawkins, empty can negative.  He has normal range of motion and strength.  Area of pain seems to be in trapezius muscle could not elicit pain on exam.  He has normal sensation and strength down to the hand no radicular symptoms.    Neurological: He is alert.  Skin: He is not diaphoretic.    Social History   Socioeconomic History  . Marital status: Single    Spouse name: Not on file  . Number of children: Not on file  . Years of education: Not on file  . Highest education level: Not on file  Occupational  History  . Not on file  Social Needs  . Financial resource strain: Not on file  . Food insecurity:    Worry: Not on file    Inability: Not on file  . Transportation needs:    Medical: Not on file    Non-medical: Not on file  Tobacco Use  . Smoking status: Never Smoker  Substance and Sexual Activity  . Alcohol use: No  . Drug use: No  . Sexual activity: Not on file  Lifestyle  . Physical activity:    Days per week: Not on file    Minutes per session: Not on file  . Stress: Not on file  Relationships  . Social connections:    Talks on phone: Not on file    Gets together: Not on file    Attends religious service: Not on file    Active member of club or organization: Not on file    Attends meetings of clubs or organizations: Not on file    Relationship status: Not on file  . Intimate partner violence:    Fear of current or ex partner: Not on file    Emotionally abused: Not on file    Physically abused: Not on file    Forced sexual activity: Not on file  Other Topics Concern  . Not on file  Social History Narrative  . Not on file  Family History  Problem Relation Age of Onset  . Heart disease Mother   . Heart disease Father   . Heart disease Maternal Grandmother     Assessment & Plan:   See Encounters Tab for problem based charting.  Patient discussed with Dr. Oswaldo DoneVincent

## 2018-02-16 NOTE — Patient Instructions (Addendum)
We will test you today for HIV and syphilis.  We will also test your cholesterol to see how that's doing and basic labs.  Continue to think about the prep as we discussed today.  Continue supportive care for your trapezius as it is already improved significantly.

## 2018-02-16 NOTE — Assessment & Plan Note (Signed)
BP Readings from Last 3 Encounters:  02/16/18 (!) 157/82  01/11/18 (!) 155/77  11/02/13 122/75   Blood pressure consistently elevated during last two hospital visits.  Pt has gained about 25lbs since 2015.  Counseled him on weight loss which can help lower blood pressure.  -will start pt on amlodipine 10mg  daily

## 2018-02-16 NOTE — Assessment & Plan Note (Signed)
No prior lipid results in our system but pt reports cholesterol was elevated when he was about 37 years old and he said he changed his diet lost some weight and never had to go on medicine.    -we will check a lipid panel today

## 2018-02-17 ENCOUNTER — Telehealth: Payer: Self-pay | Admitting: Internal Medicine

## 2018-02-17 NOTE — Telephone Encounter (Signed)
spoke with pt about test results HIV neg, syphilis neg, normal metabolic panel and cbc, we spoke about high cholesterol some strategies to lower it

## 2018-02-17 NOTE — Progress Notes (Signed)
Internal Medicine Clinic Attending  Case discussed with Dr. Frances FurbishWinfrey  at the time of the visit.  We reviewed the resident's history and exam and pertinent patient test results.  I agree with the assessment, diagnosis, and plan of care documented in the resident's note.  Patient at increased risk for STI. Labs show a positive RPR with low titer (1:2). We will need to clarify with him if he has ever been treated for syphilis. Also awaiting treponemal test result. Based on those we will decide whether to treat him with PCN. We offered PREP as a good means to lower his risk of STI and he is going to consider.

## 2018-02-18 LAB — LIPID PANEL
Chol/HDL Ratio: 5.4 ratio — ABNORMAL HIGH (ref 0.0–5.0)
Cholesterol, Total: 189 mg/dL (ref 100–199)
HDL: 35 mg/dL — AB (ref >39)
LDL Calculated: 119 mg/dL — ABNORMAL HIGH (ref 0–99)
TRIGLYCERIDES: 174 mg/dL — AB (ref 0–149)
VLDL Cholesterol Cal: 35 mg/dL (ref 5–40)

## 2018-02-18 LAB — RPR, QUANT+TP ABS (REFLEX): T Pallidum Abs: POSITIVE — AB

## 2018-02-18 LAB — CBC
HEMATOCRIT: 47 % (ref 37.5–51.0)
Hemoglobin: 16 g/dL (ref 13.0–17.7)
MCH: 27 pg (ref 26.6–33.0)
MCHC: 34 g/dL (ref 31.5–35.7)
MCV: 79 fL (ref 79–97)
Platelets: 305 10*3/uL (ref 150–450)
RBC: 5.92 x10E6/uL — ABNORMAL HIGH (ref 4.14–5.80)
RDW: 14.6 % (ref 12.3–15.4)
WBC: 7.8 10*3/uL (ref 3.4–10.8)

## 2018-02-18 LAB — CMP14 + ANION GAP
ALBUMIN: 4.2 g/dL (ref 3.5–5.5)
ALT: 19 IU/L (ref 0–44)
ANION GAP: 14 mmol/L (ref 10.0–18.0)
AST: 25 IU/L (ref 0–40)
Albumin/Globulin Ratio: 1.6 (ref 1.2–2.2)
Alkaline Phosphatase: 61 IU/L (ref 39–117)
BUN / CREAT RATIO: 8 — AB (ref 9–20)
BUN: 8 mg/dL (ref 6–20)
Bilirubin Total: 0.3 mg/dL (ref 0.0–1.2)
CO2: 24 mmol/L (ref 20–29)
CREATININE: 1.04 mg/dL (ref 0.76–1.27)
Calcium: 9 mg/dL (ref 8.7–10.2)
Chloride: 102 mmol/L (ref 96–106)
GFR calc non Af Amer: 93 mL/min/{1.73_m2} (ref >59)
GFR, EST AFRICAN AMERICAN: 108 mL/min/{1.73_m2} (ref >59)
Globulin, Total: 2.7 g/dL (ref 1.5–4.5)
Glucose: 84 mg/dL (ref 65–99)
Potassium: 4.5 mmol/L (ref 3.5–5.2)
Sodium: 140 mmol/L (ref 134–144)
Total Protein: 6.9 g/dL (ref 6.0–8.5)

## 2018-02-18 LAB — RPR: RPR: REACTIVE — AB

## 2018-02-18 LAB — HIV ANTIBODY (ROUTINE TESTING W REFLEX): HIV SCREEN 4TH GENERATION: NONREACTIVE

## 2018-03-16 ENCOUNTER — Ambulatory Visit: Payer: Medicaid Other | Admitting: Internal Medicine

## 2018-03-16 ENCOUNTER — Encounter: Payer: Self-pay | Admitting: Internal Medicine

## 2018-03-16 ENCOUNTER — Other Ambulatory Visit: Payer: Self-pay

## 2018-03-16 VITALS — BP 125/70 | HR 84 | Temp 99.1°F | Ht 67.0 in | Wt 215.8 lb

## 2018-03-16 DIAGNOSIS — Z8619 Personal history of other infectious and parasitic diseases: Secondary | ICD-10-CM

## 2018-03-16 DIAGNOSIS — Z79899 Other long term (current) drug therapy: Secondary | ICD-10-CM

## 2018-03-16 DIAGNOSIS — J309 Allergic rhinitis, unspecified: Secondary | ICD-10-CM

## 2018-03-16 DIAGNOSIS — Z7252 High risk homosexual behavior: Secondary | ICD-10-CM

## 2018-03-16 DIAGNOSIS — J3089 Other allergic rhinitis: Secondary | ICD-10-CM

## 2018-03-16 DIAGNOSIS — I10 Essential (primary) hypertension: Secondary | ICD-10-CM | POA: Diagnosis present

## 2018-03-16 MED ORDER — LORATADINE 10 MG PO TABS: 10.0000 mg | ORAL_TABLET | Freq: Every day | ORAL | 2 refills | Status: DC

## 2018-03-16 MED ORDER — EMTRICITABINE-TENOFOVIR DF 200-300 MG PO TABS: 1.0000 | ORAL_TABLET | Freq: Every day | ORAL | 1 refills | Status: AC

## 2018-03-16 NOTE — Assessment & Plan Note (Addendum)
High risk homosexual behavior: During his last visit a month ago, he reported of not being in a monogamous relationship but admitted to using condoms during intercourse.  Labs at that visit are as follows: HIV screening was negative, RPR titers 1:2, Treponema pallidum antibodies were positive and RPR was reactive.  Per patient, he has been treated in the past with 3 IM penicillin injections.  [facility] and [facility] reported that on 09/24/2015 RPR titers measured 1:16.   -Will hold off on additional treatment with penicillin as he has had fourfold decrease in RPR titers.  PrEP: -Start PrEP prophylaxis with Truvada 200-300 mg daily (normal LFTs) -Follow-up in clinic in 1 month to assess medication adherence and counseling on sexual behavior -Will obtain HIV tests every 3 months -Will screen for STIs every 3 months if high risk sexual behavior continues.

## 2018-03-16 NOTE — Assessment & Plan Note (Signed)
Allergic Rhinitis: Prior history of allergic rhinitis and now reports of continuous sneezing in the morning. Has tried Allegra D in past with no relief. Will start Claritin 10mg  daily and follow up

## 2018-03-16 NOTE — Patient Instructions (Signed)
Mr. Dean Edwards,   It was a pleasure taking care of you today. Your blood pressure is under control so continue to Amlodipine 10mg .  I am starting you on a new medication for PrEP called Truvada. You will take this medication once a day.  I want you to follow up at the clinic in 1 month and 3 months from today.  ~~Dr. Dortha SchwalbeAgyei

## 2018-03-16 NOTE — Assessment & Plan Note (Signed)
Hypertension: Well-controlled on amlodipine 10 mg daily.  BP today measures 125/70.

## 2018-03-16 NOTE — Progress Notes (Signed)
   CC: Follow up BP  HPI:  [name] is a 37 y.o.-year-old African-American gentleman with essential hypertension, high risk sexual behavior and history of syphilis status post treatment presenting for BP Follow up.  He was seen in the clinic 1 month ago and was found to have elevated blood pressures.  He was started on amlodipine 10 mg daily and has been compliant with medication.  He however does not measure home BP readings.  Hypertension: Well-controlled on amlodipine 10 mg daily.  BP today measures 125/70.  High risk homosexual behavior: During his last visit a month ago, he reported of not being in a monogamous relationship but admitted to using condoms during intercourse.  Labs at that visit are as follows: HIV screening was negative, RPR titers 1:2, Treponema pallidum antibodies were positive and RPR was reactive.  Per patient, he has been treated in the past with 3 IM penicillin injections.  [facility] and [facility] reported that on 09/24/2015 RPR titers measured 1:16.   -Will hold off on additional treatment with penicillin as he has had fourfold decrease in RPR titers.  PrEP: -Start PrEP prophylaxis with Truvada 200-300 mg daily (normal LFTs) -Follow-up in clinic in 1 month to assess medication adherence and counseling on sexual behavior -Will obtain HIV tests every 3 months -Will screen for STIs every 3 months if high risk sexual behavior continues.  Allergic Rhinitis: Prior history of allergic rhinitis and now reports of continuous sneezing in the morning. Has tried Allegra D in past with no relief. Will start Claritin 10mg  daily and follow up    Past Medical History:  Diagnosis Date  . Allergic rhinitis chronic   Review of Systems:  As per HPI  Physical Exam:  Vitals:   03/16/18 1423  BP: 125/70  Pulse: 84  Temp: 99.1 F (37.3 C)  TempSrc: Oral  SpO2: 100%  Weight: 215 lb 12.8 oz (97.9 kg)  Height: 5\' 7"  (1.702 m)   Const: In NAD,  pleasant gentleman  CV: RRR, no MGR Resp: CTABL, no wheezes crackles, rhonchi   Assessment & Plan:   See Encounters Tab for problem based charting.  Patient discusses with Dr. Criselda PeachesMullen

## 2018-03-16 NOTE — Assessment & Plan Note (Deleted)
Hypertension: Well-controlled on amlodipine 10 mg daily.  BP today measures 125/70. 

## 2018-03-19 NOTE — Progress Notes (Addendum)
Internal Medicine Clinic Attending  I saw and evaluated the patient.  I personally confirmed the key portions of the history and exam documented by Dr. Dortha SchwalbeAgyei and I reviewed pertinent patient test results.  The assessment, diagnosis, and plan were formulated together and I agree with the documentation in the resident's note.  We had a long discussion about his syphilis status and called the health department about previous titers.  I think this is most consistent with a treated syphilis which I discussed with the ID attending on phone call and was in agreement.

## 2019-08-10 ENCOUNTER — Ambulatory Visit (HOSPITAL_COMMUNITY)
Admission: EM | Admit: 2019-08-10 | Discharge: 2019-08-10 | Disposition: A | Discharge status: Home / Self Care | Payer: Medicaid Other | Attending: Family Medicine | Admitting: Family Medicine

## 2019-08-10 ENCOUNTER — Encounter (HOSPITAL_COMMUNITY): Payer: Self-pay

## 2019-08-10 ENCOUNTER — Other Ambulatory Visit: Payer: Self-pay

## 2019-08-10 DIAGNOSIS — Z20828 Contact with and (suspected) exposure to other viral communicable diseases: Secondary | ICD-10-CM | POA: Diagnosis not present

## 2019-08-10 DIAGNOSIS — J069 Acute upper respiratory infection, unspecified: Secondary | ICD-10-CM | POA: Diagnosis present

## 2019-08-10 DIAGNOSIS — R05 Cough: Secondary | ICD-10-CM

## 2019-08-10 DIAGNOSIS — Z20822 Contact with and (suspected) exposure to covid-19: Secondary | ICD-10-CM

## 2019-08-10 DIAGNOSIS — U071 COVID-19: Secondary | ICD-10-CM | POA: Insufficient documentation

## 2019-08-10 MED ORDER — FLUTICASONE PROPIONATE 50 MCG/ACT NA SUSP: 1.0000 | Freq: Every day | NASAL | 0 refills | Status: AC

## 2019-08-10 MED ORDER — BENZONATATE 200 MG PO CAPS
200.0000 mg | ORAL_CAPSULE | Freq: Three times a day (TID) | ORAL | 0 refills | Status: AC | PRN
Start: 2019-08-10 — End: 2019-08-17

## 2019-08-10 NOTE — ED Triage Notes (Signed)
Patient presents to Urgent Care with complaints of scratchy throat; headache, runny nose, upper resp drainage since 08/09/19.  Patient reports positive exposure to symptomtic father on Sunday. Denies fever, chills, n/v/d, or body aches.

## 2019-08-10 NOTE — Discharge Instructions (Signed)
Covid swab pending, monitor my chart for results, please quarantine until results return If positive quarantine for 10 days, until symptoms improving and fever free for 24 hours without medicine Continue to rest and drink plenty of fluids Tessalon every 8 hours as needed for cough Continue daily Claritin, added Flonase for congestion; may try over-the-counter Mucinex as alternative  Please follow-up if developing persistent or worsening symptoms, difficulty breathing, shortness of breath, chest pain

## 2019-08-11 ENCOUNTER — Encounter (HOSPITAL_COMMUNITY): Payer: Self-pay

## 2019-08-11 ENCOUNTER — Telehealth (HOSPITAL_COMMUNITY): Payer: Self-pay | Admitting: Emergency Medicine

## 2019-08-11 LAB — NOVEL CORONAVIRUS, NAA (HOSP ORDER, SEND-OUT TO REF LAB; TAT 18-24 HRS): SARS-CoV-2, NAA: DETECTED — AB

## 2019-08-11 NOTE — Telephone Encounter (Signed)
Patient contacted by phone and made aware of  positive covid  results. Pt verbalized understanding and had all questions answered.    

## 2019-08-11 NOTE — ED Provider Notes (Signed)
[facility]    CSN: [id number] Arrival date & time: 08/10/19  1443      History   Chief Complaint Chief Complaint  Patient presents with  . Nasal Congestion  . covid test    HPI Dean Edwards is a 38 y.o. male history of hypertension, hyperlipidemia, HIV, presenting today for evaluation of URI symptoms.  Patient symptoms began yesterday with a scratchy throat, headache, nasal congestion and drainage.  He notes that his father recently tested positive and he was around him over the weekend.  He denies any fevers chills or body aches.  Denies nausea vomiting or diarrhea.  Symptoms feel similar to his typical allergy symptoms, except for the cough, but given his exposure he is concerned about possible Covid.  He denies any chest pain or shortness of breath.  He typically will use Claritin and Flonase for allergies.  HPI  Past Medical History:  Diagnosis Date  . Allergic rhinitis chronic    Patient Active Problem List   Diagnosis Date Noted  . Trapezius muscle strain 02/16/2018  . High risk homosexual behavior 02/16/2018  . History of hyperlipidemia 02/16/2018  . Essential hypertension 02/16/2018  . External hemorrhoids 04/13/2013  . Allergic rhinitis 04/13/2013  . Preventative health care 04/13/2013    Past Surgical History:  Procedure Laterality Date  . RECONSTRUCTION OF EYELID Bilateral        Home Medications    Prior to Admission medications   Medication Sig Start Date End Date Taking? Authorizing Provider  benzonatate (TESSALON) 200 MG capsule Take 1 capsule (200 mg total) by mouth 3 (three) times daily as needed for up to 7 days for cough. 08/10/19 08/17/19  Papa Piercefield C, PA-C  emtricitabine-tenofovir (TRUVADA) 200-300 MG tablet Take 1 tablet by mouth daily. 03/16/18   Yvette RackAgyei, Obed K, MD  fluticasone (FLONASE) 50 MCG/ACT nasal spray Place 1-2 sprays into both nostrils daily for 7 days. 08/10/19 08/17/19  Tru Rana C, PA-C  amLODipine (NORVASC)  10 MG tablet Take 1 tablet (10 mg total) by mouth daily. 02/16/18 08/10/19  Angelita InglesWinfrey, William B, MD  loratadine (CLARITIN) 10 MG tablet Take 1 tablet (10 mg total) by mouth daily. 03/16/18 08/10/19  Yvette RackAgyei, Obed K, MD    Family History Family History  Problem Relation Age of Onset  . Heart disease Mother   . COPD Mother   . Heart disease Father   . Heart disease Maternal Grandmother     Social History Social History   Tobacco Use  . Smoking status: Never Smoker  Substance Use Topics  . Alcohol use: No  . Drug use: No     Allergies   Onion   Review of Systems Review of Systems  Constitutional: Negative for activity change, appetite change, chills, fatigue and fever.  HENT: Positive for congestion, rhinorrhea and sore throat. Negative for ear pain, sinus pressure and trouble swallowing.   Eyes: Negative for discharge and redness.  Respiratory: Positive for cough. Negative for chest tightness and shortness of breath.   Cardiovascular: Negative for chest pain.  Gastrointestinal: Negative for abdominal pain, diarrhea, nausea and vomiting.  Musculoskeletal: Negative for myalgias.  Skin: Negative for rash.  Neurological: Positive for headaches. Negative for dizziness and light-headedness.     Physical Exam Triage Vital Signs ED Triage Vitals  Enc Vitals Group     BP 08/10/19 1542 (!) 154/91     Pulse Rate 08/10/19 1542 99     Resp 08/10/19 1542 18  Temp 08/10/19 1542 99 F (37.2 C)     Temp Source 08/10/19 1542 Oral     SpO2 08/10/19 1542 99 %     Weight --      Height --      Head Circumference --      Peak Flow --      Pain Score 08/10/19 1539 0     Pain Loc --      Pain Edu? --      Excl. in [place]? --    No data found.  Updated Vital Signs BP (!) 154/91 (BP Location: Left Arm)   Pulse 99   Temp 99 F (37.2 C) (Oral)   Resp 18   SpO2 99%   Visual Acuity Right Eye Distance:   Left Eye Distance:   Bilateral Distance:    Right Eye Near:   Left Eye Near:     Bilateral Near:     Physical Exam Vitals and nursing note reviewed.  Constitutional:      Appearance: He is well-developed.  HENT:     Head: Normocephalic and atraumatic.     Ears:     Comments: Bilateral ears without tenderness to palpation of external auricle, tragus and mastoid, EAC's without erythema or swelling, TM's with good bony landmarks and cone of light. Non erythematous.     Mouth/Throat:     Comments: Oral mucosa pink and moist, no tonsillar enlargement or exudate. Posterior pharynx patent and nonerythematous, no uvula deviation or swelling. Normal phonation. Eyes:     Conjunctiva/sclera: Conjunctivae normal.  Cardiovascular:     Rate and Rhythm: Normal rate and regular rhythm.     Heart sounds: No murmur.  Pulmonary:     Effort: Pulmonary effort is normal. No respiratory distress.     Breath sounds: Normal breath sounds.     Comments: Breathing comfortably at rest, CTABL, no wheezing, rales or other adventitious sounds auscultated Abdominal:     Palpations: Abdomen is soft.     Tenderness: There is no abdominal tenderness.  Musculoskeletal:     Cervical back: Neck supple.  Skin:    General: Skin is warm and dry.  Neurological:     Mental Status: He is alert.      UC Treatments / Results  Labs (all labs ordered are listed, but only abnormal results are displayed) Labs Reviewed  NOVEL CORONAVIRUS, NAA (HOSP ORDER, SEND-OUT TO REF LAB; TAT 18-24 HRS) - Abnormal; Notable for the following components:      Result Value   SARS-CoV-2, NAA DETECTED (*)    All other components within normal limits    EKG   Radiology No results found.  Procedures Procedures (including critical care time)  Medications Ordered in UC Medications - No data to display  Initial Impression / Assessment and Plan / UC Course  I have reviewed the triage vital signs and the nursing notes.  Pertinent labs & imaging results that were available during my care of the patient were  reviewed by me and considered in my medical decision making (see chart for details).     Covid PCR pending, URI symptoms with cough, recent Covid exposure.  High suspicion of Covid.  Recommending quarantine and continuing symptomatic and supportive care.  Vital signs stable and lungs clear at this time.  Rest and push fluids.  Provided Flonase as well as Tessalon for cough.  Discussed strict return precautions. Patient verbalized understanding and is agreeable with plan.  Final Clinical Impressions(s) / UC  Diagnoses   Final diagnoses:  Viral URI with cough  Exposure to COVID-19 virus     Discharge Instructions     Covid swab pending, monitor my chart for results, please quarantine until results return If positive quarantine for 10 days, until symptoms improving and fever free for 24 hours without medicine Continue to rest and drink plenty of fluids Tessalon every 8 hours as needed for cough Continue daily Claritin, added Flonase for congestion; may try over-the-counter Mucinex as alternative  Please follow-up if developing persistent or worsening symptoms, difficulty breathing, shortness of breath, chest pain   ED Prescriptions    Medication Sig Dispense Auth. Provider   benzonatate (TESSALON) 200 MG capsule Take 1 capsule (200 mg total) by mouth 3 (three) times daily as needed for up to 7 days for cough. 28 capsule Raeya Merritts C, PA-C   fluticasone (FLONASE) 50 MCG/ACT nasal spray Place 1-2 sprays into both nostrils daily for 7 days. 1 g Sharae Zappulla, [place] C, PA-C     PDMP not reviewed this encounter.   Lew Dawes, [place] 08/11/19 [phone]

## 2019-08-11 NOTE — Telephone Encounter (Signed)

## 2019-09-05 ENCOUNTER — Ambulatory Visit: Payer: Medicaid Other

## 2021-03-28 ENCOUNTER — Encounter (HOSPITAL_COMMUNITY): Payer: Self-pay

## 2021-03-28 ENCOUNTER — Other Ambulatory Visit: Payer: Self-pay

## 2021-03-28 ENCOUNTER — Ambulatory Visit (HOSPITAL_COMMUNITY)
Admission: EM | Admit: 2021-03-28 | Discharge: 2021-03-28 | Disposition: A | Discharge status: Home / Self Care | Payer: Medicaid Other | Attending: Physician Assistant | Admitting: Physician Assistant

## 2021-03-28 DIAGNOSIS — J029 Acute pharyngitis, unspecified: Secondary | ICD-10-CM | POA: Diagnosis not present

## 2021-03-28 DIAGNOSIS — R030 Elevated blood-pressure reading, without diagnosis of hypertension: Secondary | ICD-10-CM | POA: Diagnosis not present

## 2021-03-28 DIAGNOSIS — R490 Dysphonia: Secondary | ICD-10-CM

## 2021-03-28 LAB — POCT RAPID STREP A, ED / UC: Streptococcus, Group A Screen (Direct): NEGATIVE

## 2021-03-28 LAB — POCT INFECTIOUS MONO SCREEN, ED / UC: Mono Screen: NEGATIVE

## 2021-03-28 MED ORDER — PREDNISONE 10 MG (21) PO TBPK: ORAL_TABLET | ORAL | 0 refills | Status: DC

## 2021-03-28 NOTE — ED Triage Notes (Signed)
Pt reports the right sided of his throat feel tight x 2 weeks after he sneezed. Denies any trouble breathing, talking.

## 2021-03-28 NOTE — Discharge Instructions (Signed)
Your mono and strep were negative.  I suspect that you have likely pulled a muscle or strained something in your throat.  We are going to try prednisone.  Please take this as prescribed.  You should not take NSAIDs including aspirin, ibuprofen/Advil/Motrin, naproxen/Aleve with this medication as a cause stomach bleeding.  I would recommend using a humidifier to help with your symptoms.  If you continue to have pain after course of medication will be worthwhile to follow-up with an ear nose and throat doctor.

## 2021-03-28 NOTE — ED Provider Notes (Signed)
[facility]    CSN: [id number] Arrival date & time: 03/28/21  1211      History   Chief Complaint Chief Complaint  Patient presents with   Sore Throat    HPI Dean Edwards is a 40 y.o. male.   Patient presents today with a 2-week history of right-sided sore throat pain.  Reports that symptoms began after he sneezed.  He then developed pain that has gradually been improving but still persist despite conservative treatment measures.  He has tried multiple medications including Naprosyn, allergy medication, decongestants without improvement of symptoms.  He reports pain is rated 7 on a 0-10 pain scale, localized to right posterior oropharynx, present with drinking/swallowing, no alleviating factors identified.  He does report some associated hoarseness but denies any difficulty swallowing, difficulty speaking, muffled voice, shortness of breath.  He denies any recent illness including cough or congestion.  He denies any known sick contacts.  Denies any recent antibiotic use.  In addition, patient was noted to have elevated blood pressure.  He does have a history of hypertension but is not currently prescribed any medication.  He denies any chest pain, shortness of breath, vision changes, leg swelling.  He is not monitoring his blood pressure at home.  Denies any recent increase in sodium consumption but does report using decongestants due to sore throat symptoms and wonders if this could be contributing to elevated blood pressure.   Past Medical History:  Diagnosis Date   Allergic rhinitis chronic    Patient Active Problem List   Diagnosis Date Noted   Trapezius muscle strain 02/16/2018   High risk homosexual behavior 02/16/2018   History of hyperlipidemia 02/16/2018   Essential hypertension 02/16/2018   External hemorrhoids 04/13/2013   Allergic rhinitis 04/13/2013   Preventative health care 04/13/2013    Past Surgical History:  Procedure Laterality Date    RECONSTRUCTION OF EYELID Bilateral        Home Medications    Prior to Admission medications   Medication Sig Start Date End Date Taking? Authorizing Provider  predniSONE (STERAPRED UNI-PAK 21 TAB) 10 MG (21) TBPK tablet As directed 03/28/21  Yes Sofiya Ezelle K, PA-C  emtricitabine-tenofovir (TRUVADA) 200-300 MG tablet Take 1 tablet by mouth daily. 03/16/18   Yvette Rack, MD  fluticasone (FLONASE) 50 MCG/ACT nasal spray Place 1-2 sprays into both nostrils daily for 7 days. 08/10/19 08/17/19  Wieters, Hallie C, PA-C  amLODipine (NORVASC) 10 MG tablet Take 1 tablet (10 mg total) by mouth daily. 02/16/18 08/10/19  Angelita Ingles, MD  loratadine (CLARITIN) 10 MG tablet Take 1 tablet (10 mg total) by mouth daily. 03/16/18 08/10/19  Yvette Rack, MD    Family History Family History  Problem Relation Age of Onset   Heart disease Mother    COPD Mother    Heart disease Father    Heart disease Maternal Grandmother     Social History Social History   Tobacco Use   Smoking status: Never   Smokeless tobacco: Never  Vaping Use   Vaping Use: Never used  Substance Use Topics   Alcohol use: No   Drug use: No     Allergies   Onion   Review of Systems Review of Systems  Constitutional:  Negative for activity change, appetite change, fatigue and fever.  HENT:  Positive for congestion, sore throat and voice change. Negative for sinus pressure, sneezing and trouble swallowing.   Eyes:  Negative for visual disturbance.  Respiratory:  Negative for cough and shortness of breath.   Cardiovascular:  Negative for chest pain.  Gastrointestinal:  Negative for abdominal pain, diarrhea, nausea and vomiting.  Neurological:  Negative for dizziness, light-headedness and headaches.    Physical Exam Triage Vital Signs ED Triage Vitals  Enc Vitals Group     BP 03/28/21 1258 (!) 163/88     Pulse Rate 03/28/21 1258 87     Resp 03/28/21 1258 18     Temp 03/28/21 1258 98.2 F (36.8 C)     Temp  Source 03/28/21 1258 Oral     SpO2 03/28/21 1258 97 %     Weight --      Height --      Head Circumference --      Peak Flow --      Pain Score 03/28/21 1255 7     Pain Loc --      Pain Edu? --      Excl. in [place]? --    No data found.  Updated Vital Signs BP (!) 163/88 (BP Location: Right Arm)   Pulse 87   Temp 98.2 F (36.8 C) (Oral)   Resp 18   SpO2 97%   Visual Acuity Right Eye Distance:   Left Eye Distance:   Bilateral Distance:    Right Eye Near:   Left Eye Near:    Bilateral Near:     Physical Exam Vitals reviewed.  Constitutional:      General: He is awake.     Appearance: Normal appearance. He is normal weight. He is not ill-appearing.     Comments: Very pleasant male appears stated age in no acute distress sitting comfortably in exam room  HENT:     Head: Normocephalic and atraumatic.     Right Ear: Tympanic membrane, ear canal and external ear normal. Tympanic membrane is not erythematous or bulging.     Left Ear: Tympanic membrane, ear canal and external ear normal. Tympanic membrane is not erythematous or bulging.     Nose: Nose normal.     Mouth/Throat:     Pharynx: Uvula midline. Posterior oropharyngeal erythema present. No oropharyngeal exudate.     Tonsils: No tonsillar exudate or tonsillar abscesses. 0 on the right. 0 on the left.  Cardiovascular:     Rate and Rhythm: Normal rate and regular rhythm.     Heart sounds: Normal heart sounds, S1 normal and S2 normal. No murmur heard. Pulmonary:     Effort: Pulmonary effort is normal. No accessory muscle usage or respiratory distress.     Breath sounds: Normal breath sounds. No stridor. No wheezing, rhonchi or rales.     Comments: Clear to auscultation bilaterally Abdominal:     General: Bowel sounds are normal.     Palpations: Abdomen is soft.     Tenderness: There is no abdominal tenderness.  Lymphadenopathy:     Head:     Right side of head: No submental, submandibular or tonsillar adenopathy.      Left side of head: No submental, submandibular or tonsillar adenopathy.     Cervical: No cervical adenopathy.  Neurological:     Mental Status: He is alert.  Psychiatric:        Behavior: Behavior is cooperative.     UC Treatments / Results  Labs (all labs ordered are listed, but only abnormal results are displayed) Labs Reviewed  CULTURE, GROUP A STREP [facility])  POCT RAPID STREP A, ED / UC  POCT INFECTIOUS MONO SCREEN,  ED / UC    EKG   Radiology No results found.  Procedures Procedures (including critical care time)  Medications Ordered in UC Medications - No data to display  Initial Impression / Assessment and Plan / UC Course  I have reviewed the triage vital signs and the nursing notes.  Pertinent labs & imaging results that were available during my care of the patient were reviewed by me and considered in my medical decision making (see chart for details).     Discussed elevated blood pressure with patient.  Patient is not currently monitoring his blood pressure at home but was encouraged to get a blood pressure cuff and begin monitoring this regularly.  If this remains above 140/90 he needs to return for reevaluation to consider medication.  Discussed that if he develops chest pain, shortness of breath, headache, dizziness, vision changes in the setting of high blood pressure he needs to go to the emergency room to which he expressed understanding.  Strep was negative in clinic today.  Mono was negative.  Throat culture obtained-results pending.  We will treat with steroids given NSAIDs have failed.  Patient was instructed not to take NSAIDs with prednisone due to risk of GI bleeding.  Encouraged him to rest his throat is much as possible and use a humidifier to help manage symptoms.  He was given contact information for ENT should symptoms persist for follow-up.  Discussed alarm symptoms that warrant emergent evaluation.  Strict return precautions given to which patient  expressed understanding.   Final Clinical Impressions(s) / UC Diagnoses   Final diagnoses:  Sore throat  Pharyngitis, unspecified etiology  Hoarseness  Elevated blood pressure reading     Discharge Instructions      Your mono and strep were negative.  I suspect that you have likely pulled a muscle or strained something in your throat.  We are going to try prednisone.  Please take this as prescribed.  You should not take NSAIDs including aspirin, ibuprofen/Advil/Motrin, naproxen/Aleve with this medication as a cause stomach bleeding.  I would recommend using a humidifier to help with your symptoms.  If you continue to have pain after course of medication will be worthwhile to follow-up with an ear nose and throat doctor.     ED Prescriptions     Medication Sig Dispense Auth. Provider   predniSONE (STERAPRED UNI-PAK 21 TAB) 10 MG (21) TBPK tablet As directed 21 tablet Lucee Brissett K, PA-C      PDMP not reviewed this encounter.   Jeani Hawking, PA-C 03/28/21 1416

## 2021-03-31 LAB — CULTURE, GROUP A STREP (THRC)

## 2021-05-14 ENCOUNTER — Encounter (HOSPITAL_COMMUNITY): Payer: Self-pay

## 2021-05-14 ENCOUNTER — Ambulatory Visit (HOSPITAL_COMMUNITY)
Admission: EM | Admit: 2021-05-14 | Discharge: 2021-05-14 | Disposition: A | Discharge status: Home / Self Care | Payer: Medicaid Other | Attending: Physician Assistant | Admitting: Physician Assistant

## 2021-05-14 ENCOUNTER — Ambulatory Visit (INDEPENDENT_AMBULATORY_CARE_PROVIDER_SITE_OTHER): Payer: Medicaid Other

## 2021-05-14 ENCOUNTER — Other Ambulatory Visit: Payer: Self-pay

## 2021-05-14 DIAGNOSIS — R059 Cough, unspecified: Secondary | ICD-10-CM | POA: Diagnosis not present

## 2021-05-14 DIAGNOSIS — J40 Bronchitis, not specified as acute or chronic: Secondary | ICD-10-CM | POA: Diagnosis not present

## 2021-05-14 DIAGNOSIS — R069 Unspecified abnormalities of breathing: Secondary | ICD-10-CM | POA: Diagnosis not present

## 2021-05-14 DIAGNOSIS — J329 Chronic sinusitis, unspecified: Secondary | ICD-10-CM | POA: Diagnosis not present

## 2021-05-14 DIAGNOSIS — R051 Acute cough: Secondary | ICD-10-CM

## 2021-05-14 MED ORDER — PREDNISONE 10 MG (21) PO TBPK: ORAL_TABLET | ORAL | 0 refills | Status: AC

## 2021-05-14 MED ORDER — AMOXICILLIN-POT CLAVULANATE 875-125 MG PO TABS: 1.0000 | ORAL_TABLET | Freq: Two times a day (BID) | ORAL | 0 refills | Status: AC

## 2021-05-14 NOTE — Discharge Instructions (Signed)
Your x-ray was normal.  We are going to start an antibiotic to treat for a sinus and bronchitis infection known as Augmentin should take twice a day for a week.  We are also restarting a prednisone taper as this can help with a lot of your symptoms.  Do not take NSAIDs including aspirin, Profen/Advil, naproxen/Aleve as it can cause stomach bleeding.  You can use Tylenol, Mucinex, Flonase for additional symptom relief.  Make sure you are resting and drinking plenty of fluid.  If you have any worsening symptoms including fever, shortness of breath, worsening cough, nausea, vomiting you need to be reevaluated immediately.

## 2021-05-14 NOTE — ED Triage Notes (Signed)
Pt presents with concerns of a respiratory infection. States his chest hurts when coughing and states his head has been hurting X 1 week.

## 2021-05-14 NOTE — ED Provider Notes (Signed)
[facility]    CSN: [id number] Arrival date & time: 05/14/21  1825      History   Chief Complaint Chief Complaint  Patient presents with   Cough   facial pressure    HPI Dean Edwards is a 40 y.o. male.   Patient presents today with a 1 week history of URI symptoms including cough.  Reports significant chest tightness, shortness of breath, nasal congestion, sore throat, drainage, sinus pressure.  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  He has tried numerous over-the-counter medications without improvement of symptoms.  Denies any recent antibiotic use.  He denies history of asthma or COPD.  He does not smoke.  He has had COVID in the past; most recently approximately a year ago.  He has had COVID-19 vaccine.   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 Authorizing Provider  amoxicillin-clavulanate (AUGMENTIN) 875-125 MG tablet Take 1 tablet by mouth every 12 (twelve) hours. 05/14/21  Yes Garwood Wentzell, Noberto Retort, PA-C  emtricitabine-tenofovir (TRUVADA) 200-300 MG tablet Take 1 tablet by mouth daily. 03/16/18   Yvette Rack, MD  fluticasone (FLONASE) 50 MCG/ACT nasal spray Place 1-2 sprays into both nostrils daily for 7 days. 08/10/19 08/17/19  Wieters, Hallie C, PA-C  predniSONE (STERAPRED UNI-PAK 21 TAB) 10 MG (21) TBPK tablet As directed 05/14/21   Jostin Rue K, PA-C   amLODipine (NORVASC) 10 MG tablet Take 1 tablet (10 mg total) by mouth daily. 02/16/18 08/10/19  Angelita Ingles, MD  loratadine (CLARITIN) 10 MG tablet Take 1 tablet (10 mg total) by mouth daily. 03/16/18 08/10/19  Yvette Rack, MD    Family History Family History  Problem Relation Age of Onset   Heart disease Mother    COPD Mother    Heart disease Father    Heart disease Maternal Grandmother     Social History Social History   Tobacco Use   Smoking status: Never   Smokeless tobacco: Never  Vaping Use   Vaping Use: Never used  Substance Use Topics   Alcohol use: No   Drug use: No     Allergies   Onion   Review of Systems Review of Systems  Constitutional:  Positive for activity change and fatigue. Negative for appetite change and fever.  HENT:  Positive for congestion, sinus pressure and sore throat. Negative for sneezing.   Respiratory:  Positive for cough, chest tightness and shortness of breath.   Cardiovascular:  Negative for chest pain.  Gastrointestinal:  Negative for abdominal pain, diarrhea, nausea and vomiting.  Musculoskeletal:  Negative for arthralgias and myalgias.  Neurological:  Positive for headaches. Negative for dizziness and light-headedness.    Physical Exam Triage Vital Signs ED Triage Vitals  Enc Vitals Group     BP 05/14/21 1911 (!) 130/93     Pulse Rate 05/14/21 1911 86  Resp 05/14/21 1911 19     Temp 05/14/21 1911 98.5 F (36.9 C)     Temp Source 05/14/21 1911 Oral     SpO2 05/14/21 1911 100 %     Weight --      Height --      Head Circumference --      Peak Flow --      Pain Score 05/14/21 1909 4     Pain Loc --      Pain Edu? --      Excl. in GC? --    No data found.  Updated Vital Signs BP (!) 130/93 (BP Location: Right Arm)   Pulse 86   Temp 98.5 F (36.9 C) (Oral)   Resp 19   SpO2 100%   Visual Acuity Right Eye Distance:   Left Eye Distance:   Bilateral Distance:    Right Eye Near:   Left Eye Near:     Bilateral Near:     Physical Exam Vitals reviewed.  Constitutional:      General: He is awake.     Appearance: Normal appearance. He is well-developed. He is not ill-appearing.     Comments: Very pleasant male appears stated age in no acute distress sitting comfortably in exam room  HENT:     Head: Normocephalic and atraumatic.     Right Ear: Tympanic membrane, ear canal and external ear normal. Tympanic membrane is not erythematous or bulging.     Left Ear: Tympanic membrane, ear canal and external ear normal. Tympanic membrane is not erythematous or bulging.     Nose: Nose normal.     Mouth/Throat:     Pharynx: Uvula midline. Posterior oropharyngeal erythema present. No oropharyngeal exudate or uvula swelling.  Cardiovascular:     Rate and Rhythm: Normal rate and regular rhythm.     Heart sounds: Normal heart sounds, S1 normal and S2 normal. No murmur heard. Pulmonary:     Effort: Pulmonary effort is normal. No accessory muscle usage or respiratory distress.     Breath sounds: No stridor. Examination of the right-lower field reveals rales. Rales present. No wheezing or rhonchi.  Neurological:     Mental Status: He is alert.  Psychiatric:        Behavior: Behavior is cooperative.     UC Treatments / Results  Labs (all labs ordered are listed, but only abnormal results are displayed) Labs Reviewed - No data to display  EKG   Radiology DG Chest 2 View  Result Date: 05/14/2021 CLINICAL DATA:  Cough and abnormal breath sounds, initial encounter EXAM: CHEST - 2 VIEW COMPARISON:  None. FINDINGS: Cardiac shadow is at the upper limits of normal in size. The lungs are well aerated bilaterally. No focal infiltrate or effusion is seen. No bony abnormality is noted. IMPRESSION: No acute abnormality seen. Electronically Signed   By: Alcide Clever M.D.   On: 05/14/2021 19:59    Procedures Procedures (including critical care time)  Medications Ordered in UC Medications - No data to  display  Initial Impression / Assessment and Plan / UC Course  I have reviewed the triage vital signs and the nursing notes.  Pertinent labs & imaging results that were available during my care of the patient were reviewed by me and considered in my medical decision making (see chart for details).      No indication for viral testing given patient has been symptomatic for over a week and this would not change management.  Chest x-ray obtained given rales on exam showed no acute abnormalities.  Patient was started on Augmentin given prolonged and worsening symptoms.  We will start prednisone taper and he was instructed not to take NSAIDs with this medication due to risk of GI bleeding.  Recommended over-the-counter medications including Tylenol, Mucinex, Flonase for additional symptom relief.  Recommended rest and drinking plenty of fluid.  Discussed alarm symptoms that warrant emergent evaluation.  Strict ER precautions given to which patient expressed understanding.  Final Clinical Impressions(s) / UC Diagnoses   Final diagnoses:  Sinobronchitis  Acute cough     Discharge Instructions      Your x-ray was normal.  We are going to start an antibiotic to treat for a sinus and bronchitis infection known as Augmentin should take twice a day for a week.  We are also restarting a prednisone taper as this can help with a lot of your symptoms.  Do not take NSAIDs including aspirin, Profen/Advil, naproxen/Aleve as it can cause stomach bleeding.  You can use Tylenol, Mucinex, Flonase for additional symptom relief.  Make sure you are resting and drinking plenty of fluid.  If you have any worsening symptoms including fever, shortness of breath, worsening cough, nausea, vomiting you need to be reevaluated immediately.     ED Prescriptions     Medication Sig Dispense Auth. Provider   predniSONE (STERAPRED UNI-PAK 21 TAB) 10 MG (21) TBPK tablet As directed 21 tablet Emmaleigh Longo K, PA-C    amoxicillin-clavulanate (AUGMENTIN) 875-125 MG tablet Take 1 tablet by mouth every 12 (twelve) hours. 14 tablet Anely Spiewak, Noberto Retort, PA-C      PDMP not reviewed this encounter.   Jeani Hawking, PA-C 05/14/21 2014

## 2022-07-24 ENCOUNTER — Other Ambulatory Visit: Payer: Self-pay

## 2022-07-24 ENCOUNTER — Emergency Department (HOSPITAL_BASED_OUTPATIENT_CLINIC_OR_DEPARTMENT_OTHER)
Admission: EM | Admit: 2022-07-24 | Discharge: 2022-07-24 | Disposition: A | Discharge status: Home / Self Care | Payer: Medicaid Other | Attending: Emergency Medicine | Admitting: Emergency Medicine

## 2022-07-24 DIAGNOSIS — K644 Residual hemorrhoidal skin tags: Secondary | ICD-10-CM

## 2022-07-24 DIAGNOSIS — K5900 Constipation, unspecified: Secondary | ICD-10-CM | POA: Diagnosis not present

## 2022-07-24 DIAGNOSIS — K6289 Other specified diseases of anus and rectum: Secondary | ICD-10-CM | POA: Diagnosis present

## 2022-07-24 MED ORDER — POLYETHYLENE GLYCOL 3350 17 GM/SCOOP PO POWD: 1.0000 | Freq: Once | ORAL | 0 refills | Status: AC

## 2022-07-24 MED ORDER — FIBERCON 625 MG PO TABS: 625.0000 mg | ORAL_TABLET | Freq: Every day | ORAL | 0 refills | Status: AC

## 2022-07-24 MED ORDER — HYDROCORTISONE (PERIANAL) 2.5 % EX CREA: 1.0000 | TOPICAL_CREAM | Freq: Two times a day (BID) | CUTANEOUS | 0 refills | Status: AC

## 2022-07-24 NOTE — ED Provider Notes (Signed)
[facility] EMERGENCY DEPT Provider Note   CSN: [id number] Arrival date & time: 07/24/22  1007     History Chief Complaint  Patient presents with   Rectal Pain    Dean Edwards is a 41 y.o. male who presents to the emergency department with a 1-1/2-week history of rectal pain primarily with defecation.  He states that he has had similar symptoms in the past approximately 1 year ago.  He was seen by his primary care doctor at that time was placed on a medication to help him have a bowel movement and that relieved his pain.  He has not had any symptoms then.  Patient identifies as a homosexual male and does conduct an anal intercourse.  He has not had any anal intercourse since August.  He denies any fever, weight loss, night sweats.  He does endorse some mild intermittent abdominal pain and some chills as well.  He denies any rectal bleeding, purulent drainage from the rectum, or syncope.  Pain is worse with defecation.  He has been taking ibuprofen as prescribed via over-the-counter dose with little relief.  HPI     Home Medications Prior to Admission medications   Medication Sig Start Date End Date Taking? Authorizing Provider  hydrocortisone (ANUSOL-HC) 2.5 % rectal cream Place 1 Application rectally 2 (two) times daily. 07/24/22  Yes Meredeth Ide, Lotus Santillo M, PA-C  polycarbophil (FIBERCON) 625 MG tablet Take 1 tablet (625 mg total) by mouth daily. 07/24/22  Yes Meredeth Ide, Eulla Kochanowski M, PA-C  polyethylene glycol powder (GLYCOLAX/MIRALAX) 17 GM/SCOOP powder Take 255 g by mouth once for 1 dose. 07/24/22 07/24/22 Yes Kamika Goodloe M, PA-C  amoxicillin-clavulanate (AUGMENTIN) 875-125 MG tablet Take 1 tablet by mouth every 12 (twelve) hours. 05/14/21   Raspet, Noberto Retort, PA-C  emtricitabine-tenofovir (TRUVADA) 200-300 MG tablet Take 1 tablet by mouth daily. 03/16/18   Yvette Rack, MD  fluticasone (FLONASE) 50 MCG/ACT nasal spray Place 1-2 sprays into both nostrils daily for 7 days. 08/10/19  08/17/19  Wieters, Hallie C, PA-C  predniSONE (STERAPRED UNI-PAK 21 TAB) 10 MG (21) TBPK tablet As directed 05/14/21   Raspet, Erin K, PA-C  amLODipine (NORVASC) 10 MG tablet Take 1 tablet (10 mg total) by mouth daily. 02/16/18 08/10/19  Angelita Ingles, MD  loratadine (CLARITIN) 10 MG tablet Take 1 tablet (10 mg total) by mouth daily. 03/16/18 08/10/19  Yvette Rack, MD      Allergies    Onion    Review of Systems   Review of Systems  All other systems reviewed and are negative.   Physical Exam Updated Vital Signs BP (!) 140/95 (BP Location: Left Arm)   Pulse 84   Temp 98.2 F (36.8 C) (Oral)   Resp 18   Ht 5\' 7"  (1.702 m)   Wt 105 kg   SpO2 100%   BMI 36.26 kg/m  Physical Exam Vitals and nursing note reviewed.  Constitutional:      Appearance: Normal appearance.  HENT:     Head: Normocephalic and atraumatic.  Eyes:     General:        Right eye: No discharge.        Left eye: No discharge.     Conjunctiva/sclera: Conjunctivae normal.  Pulmonary:     Effort: Pulmonary effort is normal.  Genitourinary:    Comments: There is a small nonthrombosed hemorrhoid at the 12 o'clock position of the external rectum.  Rectal exam did not reveal any significant signs of internal hemorrhoids,  anal fissures, or purulence.  Scant amount of stool in the rectal vault.  Difficult to get a full rectal exam secondary to pain. Skin:    General: Skin is warm and dry.     Findings: No rash.  Neurological:     General: No focal deficit present.     Mental Status: He is alert.  Psychiatric:        Mood and Affect: Mood normal.        Behavior: Behavior normal.     ED Results / Procedures / Treatments   Labs (all labs ordered are listed, but only abnormal results are displayed) Labs Reviewed - No data to display  EKG None  Radiology No results found.  Procedures Procedures    Medications Ordered in ED Medications - No data to display  ED Course/ Medical Decision Making/  A&P                           Medical Decision Making Vartan WINDELL MUSSON is a 41 y.o. male patient who presents to the emergency department today for further evaluation of rectal pain.  Given the context of the history and physical exam this is likely hemorrhoid.  I do see evidence of an external hemorrhoid although it is nonthrombosed.  I will treat him conservatively with sitz bath's, hydrocortisone cream, fiber, and MiraLAX for stool softener.  I will have him do this for 1 week and have him follow-up with his primary care doctor.  I discussed at length with him strict return precautions in the event that his symptoms do get worse.  He expressed full understanding.  Patient does not have any clinical signs today of rectal abscess or anal fissure.  Hemorrhoid does not appear to be thrombosed and does not require surgical management at this time.  No other constitutional signs.  His vital signs are normal apart from some mild high blood pressure which can be addressed in the outpatient setting.  He is safe for discharge at this time.     Final Clinical Impression(s) / ED Diagnoses Final diagnoses:  Constipation, unspecified constipation type  External hemorrhoids without complication    Rx / DC Orders ED Discharge Orders          Ordered    polyethylene glycol powder (GLYCOLAX/MIRALAX) 17 GM/SCOOP powder   Once        07/24/22 1243    polycarbophil (FIBERCON) 625 MG tablet  Daily        07/24/22 1243    hydrocortisone (ANUSOL-HC) 2.5 % rectal cream  2 times daily        07/24/22 1243              Honor Loh [place], [place] 07/24/22 1245    Cathren Laine, MD 07/24/22 1326

## 2022-07-24 NOTE — ED Notes (Signed)
Patient verbalizes understanding of discharge instructions. Opportunity for questioning and answers were provided. Patient discharged from ED.  °

## 2022-07-24 NOTE — ED Triage Notes (Signed)
Pt arrived POV, caox4, ambulatory. Pt c/o rectal pain x1 week. Pt denies bleeding or discharge. Pt denies any trauma to the rectum. Pt states he has hx of hemorrhoids over a year ago, none that he's known of since then. Pt also states he has not had a BM since the rectal pain started because of the pain. Last pain med was Ibuprofen yesterday.

## 2022-07-24 NOTE — Discharge Instructions (Signed)
Please use sitz bath's like we talked about.  You can read the handout for more specific instructions.  I have given you 3 prescriptions today.  The first is MiraLAX which will help your stool be softer and help ease the rectal pain when you have to use the bathroom.  The second 1 is fiber.  You can either pick this up or you can get over-the-counter however you want to take it.  The third is hydrocortisone cream.  This will allow the rectal pain to decrease.  Please use as prescribed.  As we discussed, I would like for you to follow-up with your primary care doctor in 1 week after you have tried these conservative measures.  Return to the emergency department for any worsening symptoms like we talked about today.

## 2022-07-28 ENCOUNTER — Emergency Department (HOSPITAL_BASED_OUTPATIENT_CLINIC_OR_DEPARTMENT_OTHER)
Admission: EM | Admit: 2022-07-28 | Discharge: 2022-07-28 | Disposition: A | Discharge status: Home / Self Care | Payer: Medicaid Other | Attending: Emergency Medicine | Admitting: Emergency Medicine

## 2022-07-28 ENCOUNTER — Encounter (HOSPITAL_BASED_OUTPATIENT_CLINIC_OR_DEPARTMENT_OTHER): Payer: Self-pay | Admitting: Emergency Medicine

## 2022-07-28 ENCOUNTER — Other Ambulatory Visit: Payer: Self-pay

## 2022-07-28 DIAGNOSIS — K6289 Other specified diseases of anus and rectum: Secondary | ICD-10-CM | POA: Diagnosis present

## 2022-07-28 DIAGNOSIS — K645 Perianal venous thrombosis: Secondary | ICD-10-CM | POA: Insufficient documentation

## 2022-07-28 MED ORDER — HYDROCODONE-ACETAMINOPHEN 5-325 MG PO TABS
2.0000 | ORAL_TABLET | Freq: Once | ORAL | Status: DC
Start: 2022-07-28 — End: 2022-07-28
  Filled 2022-07-28: qty 2

## 2022-07-28 MED ORDER — HYDROMORPHONE HCL 1 MG/ML IJ SOLN
1.0000 mg | Freq: Once | INTRAMUSCULAR | Status: AC
Start: 2022-07-28 — End: 2022-07-28
  Administered 2022-07-28: 1 mg via INTRAMUSCULAR
  Filled 2022-07-28: qty 1

## 2022-07-28 MED ORDER — ONDANSETRON 4 MG PO TBDP
8.0000 mg | ORAL_TABLET | Freq: Once | ORAL | Status: AC
  Administered 2022-07-28: 8 mg via ORAL

## 2022-07-28 MED ORDER — OXYCODONE-ACETAMINOPHEN 5-325 MG PO TABS: 1.0000 | ORAL_TABLET | Freq: Three times a day (TID) | ORAL | 0 refills | Status: DC | PRN

## 2022-07-28 MED ORDER — ONDANSETRON 4 MG PO TBDP
ORAL_TABLET | ORAL | Status: AC
  Filled 2022-07-28: qty 1

## 2022-07-28 MED ORDER — OXYCODONE-ACETAMINOPHEN 5-325 MG PO TABS: 1.0000 | ORAL_TABLET | Freq: Three times a day (TID) | ORAL | 0 refills | Status: AC | PRN

## 2022-07-28 MED ORDER — ONDANSETRON 4 MG PO TBDP
8.0000 mg | ORAL_TABLET | Freq: Once | ORAL | Status: DC
Start: 2022-07-28 — End: 2022-07-28
  Filled 2022-07-28: qty 2

## 2022-07-28 NOTE — ED Provider Notes (Signed)
[facility] EMERGENCY DEPT Provider Note   CSN: [id number] Arrival date & time: 07/28/22  8527     History {Add pertinent medical, surgical, social history, OB history to HPI:1} Chief Complaint  Patient presents with   Rectal Pain   HPI Dean Edwards is a 41 y.o. male with history of external hemorrhoids presenting for rectal pain.  Started 1-1/2 weeks ago.  States he has hemorrhoids every couple of years.  Thursday he was seen here for this complaint and was diagnosed with external hemorrhoid and prescribed hydrocortisone cream and FiberCon.  States since Thursday the pain is just much worse.  Worse with defecation.  At times he does see streaks of bright red blood in his stool.  Patient does endorse sex with men but is not engaged in sexual intercourse since August.  HPI     Home Medications Prior to Admission medications   Medication Sig Start Date End Date Taking? Authorizing Provider  amoxicillin-clavulanate (AUGMENTIN) 875-125 MG tablet Take 1 tablet by mouth every 12 (twelve) hours. 05/14/21   Raspet, Noberto Retort, PA-C  emtricitabine-tenofovir (TRUVADA) 200-300 MG tablet Take 1 tablet by mouth daily. 03/16/18   Yvette Rack, MD  fluticasone (FLONASE) 50 MCG/ACT nasal spray Place 1-2 sprays into both nostrils daily for 7 days. 08/10/19 08/17/19  Wieters, Hallie C, PA-C  hydrocortisone (ANUSOL-HC) 2.5 % rectal cream Place 1 Application rectally 2 (two) times daily. 07/24/22   Honor Loh M, PA-C  polycarbophil (FIBERCON) 625 MG tablet Take 1 tablet (625 mg total) by mouth daily. 07/24/22   Honor Loh M, PA-C  predniSONE (STERAPRED UNI-PAK 21 TAB) 10 MG (21) TBPK tablet As directed 05/14/21   Raspet, Erin K, PA-C  amLODipine (NORVASC) 10 MG tablet Take 1 tablet (10 mg total) by mouth daily. 02/16/18 08/10/19  Angelita Ingles, MD  loratadine (CLARITIN) 10 MG tablet Take 1 tablet (10 mg total) by mouth daily. 03/16/18 08/10/19  Yvette Rack, MD      Allergies     Onion    Review of Systems   Review of Systems  Physical Exam Updated Vital Signs BP (!) 152/91 (BP Location: Right Arm)   Pulse (!) 106   Temp 99.9 F (37.7 C) (Oral)   Resp 16   SpO2 100%  Physical Exam Constitutional:      Appearance: Normal appearance.  HENT:     Head: Normocephalic.     Nose: Nose normal.  Eyes:     Conjunctiva/sclera: Conjunctivae normal.  Pulmonary:     Effort: Pulmonary effort is normal.  Genitourinary:    Comments: External hemorrhoid at the 12 o'clock position of the external rectum.  No evidence of fissure.  Hemorrhoid is tender to touch, fluctuant, and slightly darker in color in comparison to surrounding skin. Neurological:     Mental Status: He is alert.  Psychiatric:        Mood and Affect: Mood normal.     ED Results / Procedures / Treatments   Labs (all labs ordered are listed, but only abnormal results are displayed) Labs Reviewed - No data to display  EKG None  Radiology No results found.  Procedures Procedures  {Document cardiac monitor, telemetry assessment procedure when appropriate:1}  Medications Ordered in ED Medications  ondansetron (ZOFRAN-ODT) disintegrating tablet 8 mg (8 mg Oral Given 07/28/22 1025)  HYDROmorphone (DILAUDID) injection 1 mg (1 mg Intramuscular Given 07/28/22 1025)    ED Course/ Medical Decision Making/ A&P  Medical Decision Making Risk Prescription drug management.   41 year old well-appearing male presenting for rectal pain.  Physical exam did reveal concern for external hemorrhoid.  Differential diagnosis for this complaint includes thrombosed versus nonthrombosed hemorrhoid, fissure, anal rectal abscess.  Symptoms are consistent with likely thrombosed external hemorrhoid.  Offered to excise potential clot burden that may be causing his pain.  Patient refused and opted to continue conservative treatment at home.  Advised that he continue his sitz bath,  hydrocortisone cream and stool softener.  Sent Percocet to his pharmacy for acute pain in the next 3 to 4 days.  Has a follow-up with his PCP.  Discussed return precautions.  {Document critical care time when appropriate:1} {Document review of labs and clinical decision tools ie heart score, Chads2Vasc2 etc:1}  {Document your independent review of radiology images, and any outside records:1} {Document your discussion with family members, caretakers, and with consultants:1} {Document social determinants of health affecting pt's care:1} {Document your decision making why or why not admission, treatments were needed:1} Final Clinical Impression(s) / ED Diagnoses Final diagnoses:  Thrombosed external hemorrhoid    Rx / DC Orders ED Discharge Orders     None

## 2022-07-28 NOTE — ED Triage Notes (Signed)
Seen here Thursday with rectal pain. Dx with hemorrhoids. Pain is ongoing.

## 2022-07-28 NOTE — ED Notes (Signed)
Pt dropped one of his zofrans on the floor , had to pull another one

## 2022-07-28 NOTE — Discharge Instructions (Signed)
Evaluation of your rectal pain revealed that you likely have a external thrombosed hemorrhoid.  Recommend that you continue your conservative treatment at home which includes hydrocortisone cream, sitz bath's, stool softener and can take Percocet for acute pain if needed.  Recommend that you follow-up with your PCP if your symptoms persist.  If pain is unbearable, you develop new fever, significant bloody stools or any other concern please return to the emergency department for further evaluation.

## 2022-12-03 IMAGING — DX DG CHEST 2V
2 series · 2 of 2 positions shown · non-contrast
Comparison: None.

CLINICAL DATA: Cough and abnormal breath sounds, initial encounter

EXAM:
CHEST - 2 VIEW

[chest pa]
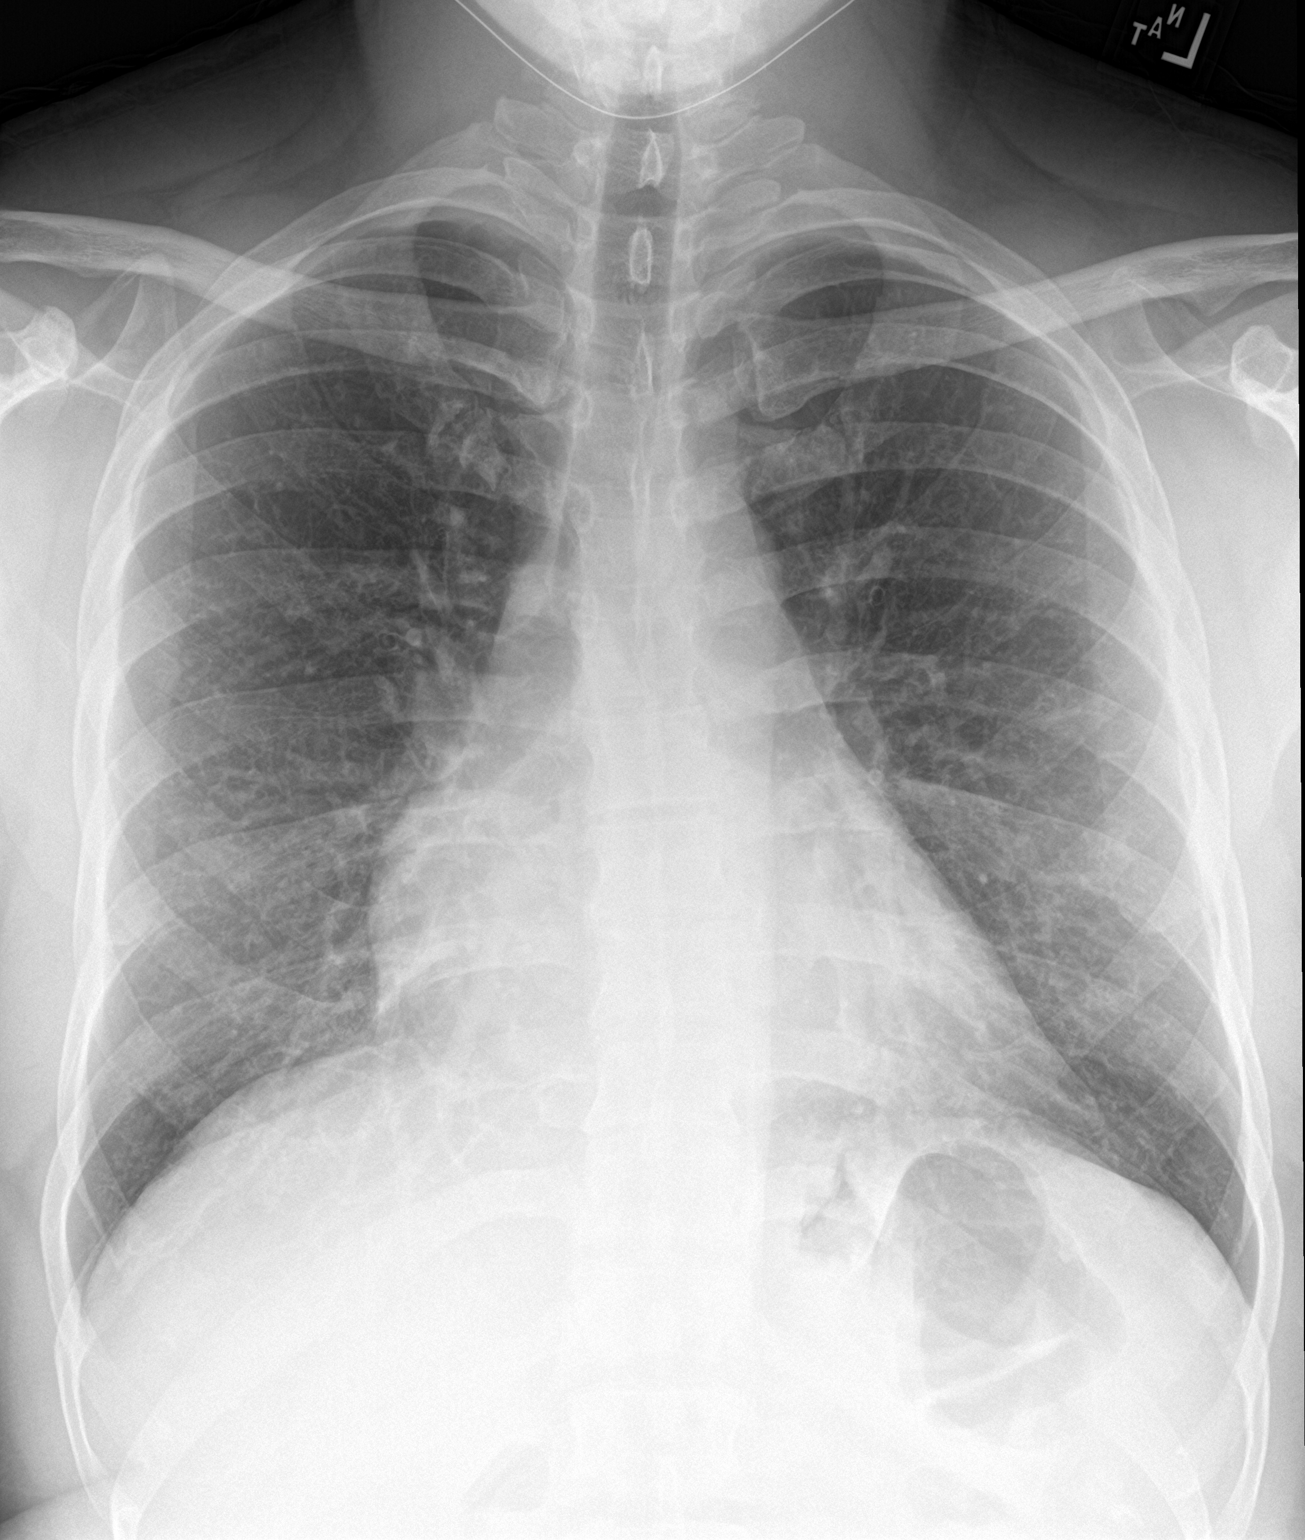

[chest lat]
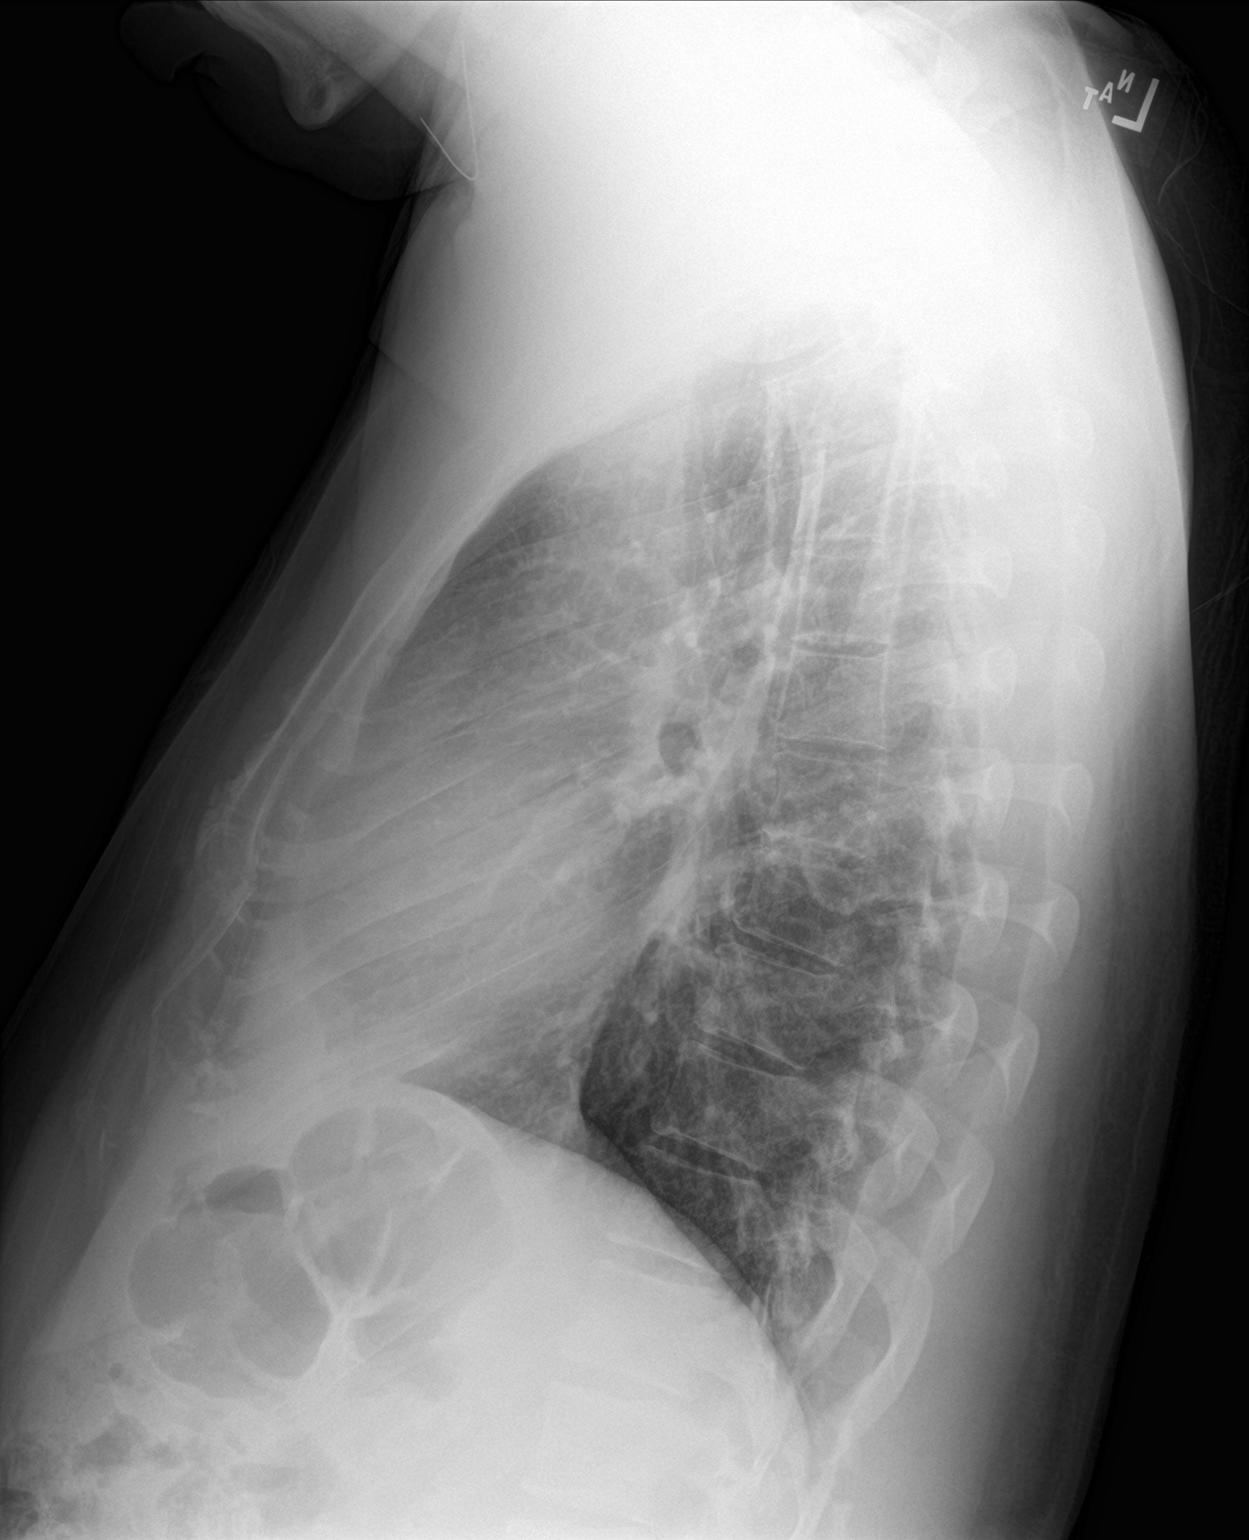

[2 of 2 positions shown; findings below may reference images not displayed]

FINDINGS: Cardiac shadow is at the upper limits of normal in size. The lungs
are well aerated bilaterally. No focal infiltrate or effusion is
seen. No bony abnormality is noted.
IMPRESSION: No acute abnormality seen.
